# Patient Record
Sex: Female | Born: 1985 | Race: Black or African American | Hispanic: No | Marital: Single | Smoking: Current every day smoker
Health system: Southern US, Community
[De-identification: ages and names within clinical notes are randomized; demographics above are authoritative.]

## PROBLEM LIST (undated history)

## (undated) DIAGNOSIS — R87629 Unspecified abnormal cytological findings in specimens from vagina: Secondary | ICD-10-CM

## (undated) DIAGNOSIS — D649 Anemia, unspecified: Secondary | ICD-10-CM

## (undated) DIAGNOSIS — J449 Chronic obstructive pulmonary disease, unspecified: Secondary | ICD-10-CM

## (undated) HISTORY — PX: NO PAST SURGERIES: SHX2092

---

## 2012-07-17 ENCOUNTER — Ambulatory Visit: Payer: Self-pay

## 2013-07-30 ENCOUNTER — Encounter (HOSPITAL_COMMUNITY): Payer: Self-pay | Admitting: *Deleted

## 2013-07-30 ENCOUNTER — Inpatient Hospital Stay (HOSPITAL_COMMUNITY): Admission: AD | Admit: 2013-07-30 | Payer: Self-pay | Admitting: Obstetrics & Gynecology

## 2013-07-30 ENCOUNTER — Inpatient Hospital Stay (HOSPITAL_COMMUNITY)
Admission: AD | Admit: 2013-07-30 | Discharge: 2013-07-30 | Disposition: A | Discharge status: Home / Self Care | Payer: Self-pay | Source: Ambulatory Visit | Attending: Obstetrics & Gynecology | Admitting: Obstetrics & Gynecology

## 2013-07-30 ENCOUNTER — Ambulatory Visit (INDEPENDENT_AMBULATORY_CARE_PROVIDER_SITE_OTHER): Payer: Self-pay | Admitting: Emergency Medicine

## 2013-07-30 VITALS — BP 132/82 | HR 83 | Temp 98.5°F | Resp 17 | Ht 61.0 in | Wt 278.0 lb

## 2013-07-30 DIAGNOSIS — A499 Bacterial infection, unspecified: Secondary | ICD-10-CM | POA: Insufficient documentation

## 2013-07-30 DIAGNOSIS — N949 Unspecified condition associated with female genital organs and menstrual cycle: Secondary | ICD-10-CM

## 2013-07-30 DIAGNOSIS — R102 Pelvic and perineal pain: Secondary | ICD-10-CM

## 2013-07-30 DIAGNOSIS — N939 Abnormal uterine and vaginal bleeding, unspecified: Secondary | ICD-10-CM

## 2013-07-30 DIAGNOSIS — B9689 Other specified bacterial agents as the cause of diseases classified elsewhere: Secondary | ICD-10-CM | POA: Insufficient documentation

## 2013-07-30 DIAGNOSIS — F17200 Nicotine dependence, unspecified, uncomplicated: Secondary | ICD-10-CM | POA: Insufficient documentation

## 2013-07-30 DIAGNOSIS — R109 Unspecified abdominal pain: Secondary | ICD-10-CM | POA: Insufficient documentation

## 2013-07-30 DIAGNOSIS — N938 Other specified abnormal uterine and vaginal bleeding: Secondary | ICD-10-CM | POA: Insufficient documentation

## 2013-07-30 DIAGNOSIS — R350 Frequency of micturition: Secondary | ICD-10-CM

## 2013-07-30 DIAGNOSIS — N760 Acute vaginitis: Secondary | ICD-10-CM | POA: Insufficient documentation

## 2013-07-30 HISTORY — DX: Unspecified abnormal cytological findings in specimens from vagina: R87.629

## 2013-07-30 LAB — URINE MICROSCOPIC-ADD ON

## 2013-07-30 LAB — WET PREP, GENITAL
Trich, Wet Prep: NONE SEEN
Yeast Wet Prep HPF POC: NONE SEEN

## 2013-07-30 LAB — CBC
HCT: 36.1 % (ref 36.0–46.0)
Hemoglobin: 12.1 g/dL (ref 12.0–15.0)
MCH: 26.6 pg (ref 26.0–34.0)
MCHC: 33.5 g/dL (ref 30.0–36.0)
MCV: 79.3 fL (ref 78.0–100.0)
PLATELETS: 283 10*3/uL (ref 150–400)
RBC: 4.55 MIL/uL (ref 3.87–5.11)
RDW: 15.3 % (ref 11.5–15.5)
WBC: 11.2 10*3/uL — AB (ref 4.0–10.5)

## 2013-07-30 LAB — POCT URINALYSIS DIPSTICK
Bilirubin, UA: NEGATIVE
Glucose, UA: NEGATIVE
KETONES UA: NEGATIVE
Nitrite, UA: NEGATIVE
Protein, UA: NEGATIVE
Spec Grav, UA: 1.015
Urobilinogen, UA: 1
pH, UA: 7

## 2013-07-30 LAB — POCT UA - MICROSCOPIC ONLY
CASTS, UR, LPF, POC: NEGATIVE
Crystals, Ur, HPF, POC: NEGATIVE
Mucus, UA: POSITIVE
RBC, urine, microscopic: NEGATIVE
Yeast, UA: NEGATIVE

## 2013-07-30 LAB — URINALYSIS, ROUTINE W REFLEX MICROSCOPIC
Bilirubin Urine: NEGATIVE
Glucose, UA: NEGATIVE mg/dL
Ketones, ur: NEGATIVE mg/dL
Nitrite: NEGATIVE
Protein, ur: NEGATIVE mg/dL
SPECIFIC GRAVITY, URINE: 1.01 (ref 1.005–1.030)
UROBILINOGEN UA: 1 mg/dL (ref 0.0–1.0)
pH: 6 (ref 5.0–8.0)

## 2013-07-30 LAB — POCT URINE PREGNANCY: PREG TEST UR: NEGATIVE

## 2013-07-30 MED ORDER — METRONIDAZOLE 500 MG PO TABS: 500.0000 mg | ORAL_TABLET | Freq: Two times a day (BID) | ORAL | Status: DC

## 2013-07-30 NOTE — Patient Instructions (Addendum)
Please go immediately to [facility] ER for further evaluation    Driving directions to [facility]  [phone]  - more info    [address]  [place], [place] [address]     1. Head south on [address] toward [address]      0.5 mi    2. Sharp left onto [address]      0.6 mi    3. Turn left onto the [address] ramp      0.2 mi    4. Merge onto [address]      3.0 mi    5. Slight right toward [address] (signs for [address])      0.2 mi    6. Take the ramp to [address]      338 ft    7. Turn left onto [address]      0.3 mi    8. Turn left onto [address]  Destination will be on the right     0.2 mi     [facility]  [address]

## 2013-07-30 NOTE — MAU Provider Note (Signed)
History     CSN: [id number]  Arrival date and time: 07/30/13 1425   First Provider Initiated Contact with Patient 07/30/13 1606      Chief Complaint  Patient presents with  . Vaginal Bleeding   HPI Maria Mccarthy is a 28 y.o. G0P0 who presents to MAU today with complaint of vaginal bleeding daily x 2 years. The patient states that about 50% of the time the bleeding is heavy, otherwise she has light bleeding or spotting. The patient has a history of irregular bleeding and had work[place]up in [place] in [place] but moved from there prior to completing work-up and is unsure of the results. She is also having lower abdominal pain that comes and goes and occasional vaginal pains as well x 2 years. She denies fever, discharge or N/V/D today. She has not been on anything for birth control recently.   OB History   Grav Para Term Preterm Abortions TAB SAB Ect Mult Living   0         0      Past Medical History  Diagnosis Date  . Vaginal Pap smear, abnormal     Past Surgical History  Procedure Laterality Date  . No past surgeries      Family History  Problem Relation Age of Onset  . Mental illness Mother   . Diabetes Maternal Grandmother   . Hypertension Maternal Grandmother   . Diabetes Maternal Grandfather   . Diabetes Paternal Grandfather     History  Substance Use Topics  . Smoking status: Current Some Day Smoker -- 0.25 packs/day  . Smokeless tobacco: Not on file  . Alcohol Use: No    Allergies: No Known Allergies  No prescriptions prior to admission    Review of Systems  Constitutional: Negative for fever and malaise/fatigue.  Gastrointestinal: Positive for abdominal pain. Negative for nausea, vomiting and diarrhea.  Genitourinary: Negative for dysuria, urgency and frequency.       + vaginal bleeding  Neurological: Negative for dizziness, loss of consciousness and weakness.   Physical Exam   Blood pressure 133/79, pulse 88, temperature 98.3 F (36.8 C),  temperature source Oral, resp. rate 18, height 5' 3.75" (1.619 m), weight 279 lb 6.4 oz (126.735 kg), last menstrual period 07/30/2013, SpO2 100.00%.  Physical Exam  Constitutional: She is oriented to person, place, and time. She appears well-developed and well-nourished. No distress.  HENT:  Head: Normocephalic and atraumatic.  Cardiovascular: Normal rate.   Respiratory: Effort normal.  GI: Soft. Bowel sounds are normal. She exhibits no distension and no mass. There is tenderness (mild tenderness to palpation of the lower abdomen). There is no rebound and no guarding.  Genitourinary: Uterus is not enlarged (exam limited by patient body habitus) and not tender. Cervix exhibits no motion tenderness, no discharge and no friability. Right adnexum displays no mass and no tenderness. Left adnexum displays no mass and no tenderness. There is bleeding (scant blood) around the vagina. Vaginal discharge (scant thin, white discharge noted) found.  Neurological: She is alert and oriented to person, place, and time.  Skin: Skin is warm and dry. No erythema.  Psychiatric: She has a normal mood and affect.   Results for orders placed during the hospital encounter of 07/30/13 (from the past 24 hour(s))  URINALYSIS, ROUTINE W REFLEX MICROSCOPIC     Status: Abnormal   Collection Time    07/30/13  3:17 PM      Result Value Ref Range   Color, Urine YELLOW  YELLOW   APPearance CLEAR  CLEAR   Specific Gravity, Urine 1.010  1.005 - 1.030   pH 6.0  5.0 - 8.0   Glucose, UA NEGATIVE  NEGATIVE mg/dL   Hgb urine dipstick LARGE (*) NEGATIVE   Bilirubin Urine NEGATIVE  NEGATIVE   Ketones, ur NEGATIVE  NEGATIVE mg/dL   Protein, ur NEGATIVE  NEGATIVE mg/dL   Urobilinogen, UA 1.0  0.0 - 1.0 mg/dL   Nitrite NEGATIVE  NEGATIVE   Leukocytes, UA SMALL (*) NEGATIVE  URINE MICROSCOPIC-ADD ON     Status: Abnormal   Collection Time    07/30/13  3:17 PM      Result Value Ref Range   Squamous Epithelial / LPF FEW (*) RARE    WBC, UA 3-6  <3 WBC/hpf   RBC / HPF 0-2  <3 RBC/hpf  CBC     Status: Abnormal   Collection Time    07/30/13  3:45 PM      Result Value Ref Range   WBC 11.2 (*) 4.0 - 10.5 K/uL   RBC 4.55  3.87 - 5.11 MIL/uL   Hemoglobin 12.1  12.0 - 15.0 g/dL   HCT 27.236.1  53.636.0 - 64.446.0 %   MCV 79.3  78.0 - 100.0 fL   MCH 26.6  26.0 - 34.0 pg   MCHC 33.5  30.0 - 36.0 g/dL   RDW 03.415.3  74.211.5 - 59.515.5 %   Platelets 283  150 - 400 K/uL  WET PREP, GENITAL     Status: Abnormal   Collection Time    07/30/13  5:03 PM      Result Value Ref Range   Yeast Wet Prep HPF POC NONE SEEN  NONE SEEN   Trich, Wet Prep NONE SEEN  NONE SEEN   Clue Cells Wet Prep HPF POC FEW (*) NONE SEEN   WBC, Wet Prep HPF POC FEW (*) NONE SEEN     MAU Course  Procedures None  MDM UPT- negative at Urgent Care today UA, wet prep, GC/Chlamydia, CBC Patient is hemodynamically stable today. 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 They will call her with an appointment Patient may return to MAU as needed or if her condition were to change or worsen  Freddi StarrJulie N Ethier, PA-C  07/30/2013, 8:34 PM

## 2013-07-30 NOTE — Discharge Instructions (Signed)

## 2013-07-30 NOTE — Progress Notes (Signed)
   This chart was scribed for Maria ChickKristi M Smith, MD by [name], ED Scribe. This patient was seen in room 5 and the patient's care was started at 12:31 PM.  Subjective:    Patient ID: Maria LemaSharonda Mccarthy, female    DOB: 28-Sep-1985, 28 y.o.   MRN: [id number]  HPI HPI Comments: Maria LemaSharonda Bianca is a 28 y.o. female who [facility] complaining of abdominal pain that radiates to her sides and vaginal area over the past 3 weeks. Pt reports associated vaginal discharge, cloudy urine, and urine odor. She states she's been bleeding nonstop for roughly a year and 8 months. She states she is not on any birth control. She states her insurance for her new job has not yet kicked in. Pt reports she would like to see a gynecologist.    Review of Systems  Gastrointestinal: Positive for abdominal pain.  Genitourinary: Positive for vaginal bleeding, vaginal discharge and vaginal pain.       Urinary odor and cloudiness       Objective:   Physical Exam CONSTITUTIONAL: Well developed/well nourished HEAD: Normocephalic/atraumatic EYES: EOMI/PERRL ENMT: Mucous membranes moist NECK: supple no meningeal signs SPINE:entire spine nontender CV: S1/S2 noted, no murmurs/rubs/gallops noted LUNGS: Lungs are clear to auscultation bilaterally, no apparent distress ABDOMEN: soft, nontender, no rebound or guarding, 1.5cm tissue mass protruding from the cervical os GU:no cva tenderness NEURO: Pt is awake/alert, moves all extremitiesx4 EXTREMITIES: pulses normal, full ROM SKIN: warm, color normal PSYCH: no abnormalities of mood noted   Results for orders placed in visit on 07/30/13  POCT URINALYSIS DIPSTICK      Result Value Ref Range   Color, UA yellow     Clarity, UA clear     Glucose, UA neg     Bilirubin, UA neg     Ketones, UA neg     Spec Grav, UA 1.015     Blood, UA trace     pH, UA 7.0     Protein, UA neg     Urobilinogen, UA 1.0     Nitrite, UA neg     Leukocytes, UA Trace    POCT URINE PREGNANCY      Result Value Ref Range   Preg Test, Ur Negative    POCT UA - MICROSCOPIC ONLY      Result Value Ref Range   WBC, Ur, HPF, POC 0-3     RBC, urine, microscopic neg     Bacteria, U Microscopic 2+     Mucus, UA positive     Epithelial cells, urine per micros 0-1     Crystals, Ur, HPF, POC neg     Casts, Ur, LPF, POC neg     Yeast, UA neg         & Plan:  Initial recording of the pregnancy test was positive but apparently this was a computer entry error. Her pregnancy test is not positive. She certainly could have an endometrial cast versus degenerating polyp which is extruding through the cervical os. She will be sent to women's for their further evaluation. Apparently she has had long-standing problems with vaginal bleeding. I personally performed the services described in this documentation, which was scribed in my presence. The recorded information has been reviewed and is accurate.

## 2013-07-30 NOTE — MAU Note (Addendum)
Pt states she has been having daily vaginal bleeding x 2 years now and hasn't seen a GYN for 2 years as well.  Pt presented to an urgent care today and was told to come to MAU for further evaluation.  Pt presented there with C/O of foul odor to urine.Pt states she has been having intermit. abd pain which she rates at 8 of 10 when she has the pain.  Pt states the MD said some tissue was coming out of the uterus/cervix.

## 2013-07-31 LAB — GC/CHLAMYDIA PROBE AMP
CT PROBE, AMP APTIMA: NEGATIVE
GC PROBE AMP APTIMA: NEGATIVE

## 2013-08-05 ENCOUNTER — Encounter: Payer: Self-pay | Admitting: Obstetrics & Gynecology

## 2013-08-07 ENCOUNTER — Ambulatory Visit (HOSPITAL_COMMUNITY)
Admission: RE | Admit: 2013-08-07 | Discharge: 2013-08-07 | Disposition: A | Discharge status: Home / Self Care | Payer: Self-pay | Source: Ambulatory Visit | Attending: Medical | Admitting: Medical

## 2013-08-07 DIAGNOSIS — N938 Other specified abnormal uterine and vaginal bleeding: Secondary | ICD-10-CM | POA: Insufficient documentation

## 2013-08-07 DIAGNOSIS — N8300 Follicular cyst of ovary, unspecified side: Secondary | ICD-10-CM | POA: Insufficient documentation

## 2013-08-07 DIAGNOSIS — N854 Malposition of uterus: Secondary | ICD-10-CM | POA: Insufficient documentation

## 2013-08-07 DIAGNOSIS — N949 Unspecified condition associated with female genital organs and menstrual cycle: Secondary | ICD-10-CM | POA: Insufficient documentation

## 2013-08-07 DIAGNOSIS — N83209 Unspecified ovarian cyst, unspecified side: Secondary | ICD-10-CM | POA: Insufficient documentation

## 2013-08-07 DIAGNOSIS — N939 Abnormal uterine and vaginal bleeding, unspecified: Secondary | ICD-10-CM

## 2013-08-19 ENCOUNTER — Telehealth: Payer: Self-pay | Admitting: *Deleted

## 2013-08-19 ENCOUNTER — Telehealth: Payer: Self-pay | Admitting: General Practice

## 2013-08-19 NOTE — Telephone Encounter (Signed)
Patient called and left message stating she received a letter in the mail with her next appt with Dr Debroah LoopArnold but has not heard back yet about her results and would like for someone to call her. Called patient and informed her of normal results except for bacterial vaginosis that came back, but she has already been treated for that. Patient verbalized understanding and had no further questions

## 2013-08-19 NOTE — Telephone Encounter (Signed)
Pt called nurse line requesting lab results from MAU and sonogram.  Gave results and pt continues to have concern over whether she was pregnant,  Informed her pregnancy test here was NEGATIVE.  Educated patient that provider will go over all information at her next visit.  Pt verbalizes understanding.

## 2013-09-11 ENCOUNTER — Ambulatory Visit (INDEPENDENT_AMBULATORY_CARE_PROVIDER_SITE_OTHER): Payer: Self-pay | Admitting: Obstetrics & Gynecology

## 2013-09-11 ENCOUNTER — Encounter: Payer: Self-pay | Admitting: Obstetrics & Gynecology

## 2013-09-11 VITALS — BP 96/63 | HR 73 | Temp 97.8°F | Ht 62.0 in | Wt 276.0 lb

## 2013-09-11 DIAGNOSIS — N390 Urinary tract infection, site not specified: Secondary | ICD-10-CM

## 2013-09-11 DIAGNOSIS — N938 Other specified abnormal uterine and vaginal bleeding: Secondary | ICD-10-CM

## 2013-09-11 DIAGNOSIS — N949 Unspecified condition associated with female genital organs and menstrual cycle: Secondary | ICD-10-CM

## 2013-09-11 LAB — POCT URINALYSIS DIP (DEVICE)
Bilirubin Urine: NEGATIVE
Glucose, UA: NEGATIVE mg/dL
Ketones, ur: NEGATIVE mg/dL
Nitrite: POSITIVE — AB
Protein, ur: NEGATIVE mg/dL
Specific Gravity, Urine: 1.025 (ref 1.005–1.030)
UROBILINOGEN UA: 0.2 mg/dL (ref 0.0–1.0)
pH: 6 (ref 5.0–8.0)

## 2013-09-11 MED ORDER — NORGESTIM-ETH ESTRAD TRIPHASIC 0.18/0.215/0.25 MG-35 MCG PO TABS: 1.0000 | ORAL_TABLET | Freq: Every day | ORAL | Status: DC

## 2013-09-11 MED ORDER — SULFAMETHOXAZOLE-TMP DS 800-160 MG PO TABS: 1.0000 | ORAL_TABLET | Freq: Two times a day (BID) | ORAL | Status: DC

## 2013-09-11 NOTE — Patient Instructions (Signed)
Oral Contraception Information  Oral contraceptive pills (OCPs) are medicines taken to prevent pregnancy. OCPs work by preventing the ovaries from releasing eggs. The hormones in OCPs also cause the cervical mucus to thicken, preventing the sperm from entering the uterus. The hormones also cause the uterine lining to become thin, not allowing a fertilized egg to attach to the inside of the uterus. OCPs are highly effective when taken exactly as prescribed. However, OCPs do not prevent sexually transmitted diseases (STDs). Safe sex practices, such as using condoms along with the pill, can help prevent STDs.   Before taking the pill, you may have a physical exam and Pap test. Your health care provider may order blood tests. The health care provider will make sure you are a good candidate for oral contraception. Discuss with your health care provider the possible side effects of the OCP you may be prescribed. When starting an OCP, it can take 2 to 3 months for the body to adjust to the changes in hormone levels in your body.   TYPES OF ORAL CONTRACEPTION  · The combination pill This pill contains estrogen and progestin (synthetic progesterone) hormones. The combination pill comes in 21-day, 28-day, or 91-day packs. Some types of combination pills are meant to be taken continuously (365-day pills). With 21-day packs, you do not take pills for 7 days after the last pill. With 28-day packs, the pill is taken every day. The last 7 pills are without hormones. Certain types of pills have more than 21 hormone-containing pills. With 91-day packs, the first 84 pills contain both hormones, and the last 7 pills contain no hormones or contain estrogen only.  · The minipill This pill contains the progesterone hormone only. The pill is taken every day continuously. It is very important to take the pill at the same time each day. The minipill comes in packs of 28 pills. All 28 pills contain the hormone.    ADVANTAGES OF ORAL  CONTRACEPTIVE PILLS  · Decreases premenstrual symptoms.    · Treats menstrual period cramps.    · Regulates the menstrual cycle.    · Decreases a heavy menstrual flow.    · May treat acne, depending on the type of pill.    · Treats abnormal uterine bleeding.    · Treats polycystic ovarian syndrome.    · Treats endometriosis.    · Can be used as emergency contraception.    THINGS THAT CAN MAKE ORAL CONTRACEPTIVE PILLS LESS EFFECTIVE  OCPs can be less effective if:   · You forget to take the pill at the same time every day.    · You have a stomach or intestinal disease that lessens the absorption of the pill.    · You take OCPs with other medicines that make OCPs less effective, such as antibiotics, certain HIV medicines, and some seizure medicines.    · You take expired OCPs.    · You forget to restart the pill on day 7, when using the packs of 21 pills.    RISKS ASSOCIATED WITH ORAL CONTRACEPTIVE PILLS   Oral contraceptive pills can sometimes cause side effects, such as:  · Headache.  · Nausea.  · Breast tenderness.  · Irregular bleeding or spotting.  Combination pills are also associated with a small increased risk of:  · Blood clots.  · Heart attack.  · Stroke.  Document Released: 06/17/2002 Document Revised: 01/15/2013 Document Reviewed: 09/15/2012  Exit[employer]® Patient Information ©2014 [employer].

## 2013-09-11 NOTE — Progress Notes (Signed)
Patient ID: Maria Mccarthy, female   DOB: Oct 11, 1985, 28 y.o.   MRN: [id number]  Chief Complaint  Patient presents with  . Follow-up    HPI Maria Mccarthy is a 28 y.o. female.  G1P0010 Patient's last menstrual period was 09/11/2013. H/O DUB for years, was partially evaluated in [place] but received no Dx. Bleeding stopped for a few weeks after last MAU visit but some bleeding today  HPI  Past Medical History  Diagnosis Date  . Vaginal Pap smear, abnormal     Past Surgical History  Procedure Laterality Date  . No past surgeries      Family History  Problem Relation Age of Onset  . Mental illness Mother   . Diabetes Maternal Grandmother   . Hypertension Maternal Grandmother   . Diabetes Maternal Grandfather   . Diabetes Paternal Grandfather     Social History History  Substance Use Topics  . Smoking status: Current Some Day Smoker -- 0.25 packs/day    Types: Cigarettes  . Smokeless tobacco: Not on file  . Alcohol Use: No    No Known Allergies  Current Outpatient Prescriptions  Medication Sig Dispense Refill  . Norgestimate-Ethinyl Estradiol Triphasic 0.18/0.215/0.25 MG-35 MCG tablet Take 1 tablet by mouth daily.  1 Package  11  . sulfamethoxazole-trimethoprim (BACTRIM DS) 800-160 MG per tablet Take 1 tablet by mouth 2 (two) times daily.  6 tablet  0   No current facility-administered medications for this visit.    Review of Systems Review of Systems  Constitutional: Negative.   Genitourinary: Positive for vaginal bleeding and menstrual problem. Negative for vaginal discharge and pelvic pain.    Blood pressure 96/63, pulse 73, temperature 97.8 F (36.6 C), height 5\' 2"  (1.575 m), weight 125.193 kg (276 lb), last menstrual period 09/11/2013.  Physical Exam Physical Exam  Constitutional: She appears well-developed. No distress.  Pulmonary/Chest: Effort normal.  Abdominal: Soft.  Genitourinary: Vagina normal and uterus normal. No vaginal discharge found.  Skin:  Skin is warm and dry.  Psychiatric: She has a normal mood and affect.    Data Reviewed [place] normal  Assessment    DUB, likely anovulation     Plan    Ortho Tricyclen Pap in 3 months, RTC        Adam Phenix 09/11/2013, 3:00 PM

## 2013-09-11 NOTE — Progress Notes (Signed)
Pt feels like she has urinary tract infection due to frequency of urination.  Pt states she also has sharp pains in her vagina

## 2014-02-09 ENCOUNTER — Encounter: Payer: Self-pay | Admitting: Obstetrics & Gynecology

## 2014-06-10 ENCOUNTER — Encounter (HOSPITAL_COMMUNITY): Payer: Self-pay | Admitting: Emergency Medicine

## 2014-06-10 ENCOUNTER — Emergency Department (HOSPITAL_COMMUNITY)
Admission: EM | Admit: 2014-06-10 | Discharge: 2014-06-10 | Disposition: A | Discharge status: Home / Self Care | Payer: Self-pay | Attending: Emergency Medicine | Admitting: Emergency Medicine

## 2014-06-10 DIAGNOSIS — Z3202 Encounter for pregnancy test, result negative: Secondary | ICD-10-CM | POA: Insufficient documentation

## 2014-06-10 DIAGNOSIS — Z793 Long term (current) use of hormonal contraceptives: Secondary | ICD-10-CM | POA: Insufficient documentation

## 2014-06-10 DIAGNOSIS — R319 Hematuria, unspecified: Secondary | ICD-10-CM | POA: Insufficient documentation

## 2014-06-10 DIAGNOSIS — R300 Dysuria: Secondary | ICD-10-CM | POA: Insufficient documentation

## 2014-06-10 DIAGNOSIS — Z79899 Other long term (current) drug therapy: Secondary | ICD-10-CM | POA: Insufficient documentation

## 2014-06-10 DIAGNOSIS — J029 Acute pharyngitis, unspecified: Secondary | ICD-10-CM | POA: Insufficient documentation

## 2014-06-10 DIAGNOSIS — Z720 Tobacco use: Secondary | ICD-10-CM | POA: Insufficient documentation

## 2014-06-10 LAB — URINE MICROSCOPIC-ADD ON

## 2014-06-10 LAB — URINALYSIS, ROUTINE W REFLEX MICROSCOPIC
BILIRUBIN URINE: NEGATIVE
Glucose, UA: NEGATIVE mg/dL
Ketones, ur: NEGATIVE mg/dL
Leukocytes, UA: NEGATIVE
Nitrite: POSITIVE — AB
PH: 6.5 (ref 5.0–8.0)
Protein, ur: NEGATIVE mg/dL
Specific Gravity, Urine: 1.035 — ABNORMAL HIGH (ref 1.005–1.030)
Urobilinogen, UA: 1 mg/dL (ref 0.0–1.0)

## 2014-06-10 LAB — RAPID STREP SCREEN (MED CTR MEBANE ONLY): Streptococcus, Group A Screen (Direct): NEGATIVE

## 2014-06-10 LAB — PREGNANCY, URINE: PREG TEST UR: NEGATIVE

## 2014-06-10 MED ORDER — HYDROCODONE-ACETAMINOPHEN 7.5-325 MG/15ML PO SOLN: 15.0000 mL | Freq: Three times a day (TID) | ORAL | Status: DC | PRN

## 2014-06-10 MED ORDER — BENZONATATE 100 MG PO CAPS
100.0000 mg | ORAL_CAPSULE | Freq: Once | ORAL | Status: AC
  Administered 2014-06-10: 100 mg via ORAL
  Filled 2014-06-10: qty 1

## 2014-06-10 MED ORDER — SULFAMETHOXAZOLE-TRIMETHOPRIM 800-160 MG PO TABS: 1.0000 | ORAL_TABLET | Freq: Two times a day (BID) | ORAL | Status: DC

## 2014-06-10 NOTE — ED Notes (Signed)
Pt alert and oriented x4. Respirations even and unlabored, bilateral symmetrical rise and fall of chest. Skin warm and dry. In no acute distress. Denies needs.   

## 2014-06-10 NOTE — ED Provider Notes (Signed)
CSN: [id number]     Arrival date & time 06/10/14  1016 History   First MD Initiated Contact with Patient 06/10/14 1210     Chief Complaint  Patient presents with  . Cough  . Sore Throat  . Hematuria     (Consider location/radiation/quality/duration/timing/severity/associated sxs/prior Treatment) HPI Comments: The patient is a 29 year old female, she presents to the hospital with approximately one week of cough, sore throat and hoarseness. The hoarseness of her voice started several days ago, she denies fevers chills nausea or vomiting and has had no diarrhea. The symptoms are persistent, nothing makes this better or worse, she has used over-the-counter medications without relief, she continues to cough, more at night.  Patient is a 29 y.o. female presenting with cough, pharyngitis, and hematuria. The history is provided by the patient.  Cough Sore Throat  Hematuria    Past Medical History  Diagnosis Date  . Vaginal Pap smear, abnormal    Past Surgical History  Procedure Laterality Date  . No past surgeries     Family History  Problem Relation Age of Onset  . Mental illness Mother   . Diabetes Maternal Grandmother   . Hypertension Maternal Grandmother   . Diabetes Maternal Grandfather   . Diabetes Paternal Grandfather    History  Substance Use Topics  . Smoking status: Current Some Day Smoker -- 0.25 packs/day    Types: Cigarettes  . Smokeless tobacco: Not on file  . Alcohol Use: No   OB History    Gravida Para Term Preterm AB TAB SAB Ectopic Multiple Living   1    1  1    0     Review of Systems  Respiratory: Positive for cough.   Genitourinary: Positive for hematuria.  All other systems reviewed and are negative.     Allergies  Review of patient's allergies indicates no known allergies.  Home Medications   Prior to Admission medications   Medication Sig Start Date End Date Taking? Authorizing Provider  acetaminophen (TYLENOL) 325 MG tablet Take 650 mg  by mouth every 6 (six) hours as needed for moderate pain or fever.   Yes Historical Provider, MD  Cyanocobalamin (VITAMIN B 12 PO) Take 1 tablet by mouth daily.   Yes Historical Provider, MD  DM-Doxylamine-Acetaminophen (NYQUIL COLD & FLU PO) Take 10 mLs by mouth every 6 (six) hours as needed (cold and flu symptoms).   Yes Historical Provider, MD  Multiple Vitamins-Minerals (MULTIVITAMIN ADULT PO) Take 1 tablet by mouth daily.   Yes Historical Provider, MD  HYDROcodone-acetaminophen (HYCET) 7.5-325 mg/15 ml solution Take 15 mLs by mouth every 8 (eight) hours as needed for moderate pain. 06/10/14   Vida RollerBrian D Kaz Auld, MD  Norgestimate-Ethinyl Estradiol Triphasic 0.18/0.215/0.25 MG-35 MCG tablet Take 1 tablet by mouth daily. 09/11/13   Adam PhenixJames G Arnold, MD  sulfamethoxazole-trimethoprim (SEPTRA DS) 800-160 MG per tablet Take 1 tablet by mouth 2 (two) times daily. 06/10/14   Vida RollerBrian D Marti Acebo, MD   BP 102/78 mmHg  Pulse 72  Temp(Src) 97.6 F (36.4 C) (Oral)  Resp 17  SpO2 100% Physical Exam  Constitutional: She appears well-developed and well-nourished. No distress.  HENT:  Head: Normocephalic and atraumatic.  Mouth/Throat: No oropharyngeal exudate.  Pharyngeal erythema, no exudate asymmetry or hypertrophy, mucous members are moist, voice is hoarse  Eyes: Conjunctivae and EOM are normal. Pupils are equal, round, and reactive to light. Right eye exhibits no discharge. Left eye exhibits no discharge. No scleral icterus.  Neck: Normal range of  motion. Neck supple. No JVD present. No thyromegaly present.  No lymphadenopathy of the neck  Cardiovascular: Normal rate, regular rhythm, normal heart sounds and intact distal pulses.  Exam reveals no gallop and no friction rub.   No murmur heard. Pulmonary/Chest: Effort normal and breath sounds normal. No respiratory distress. She has no wheezes. She has no rales.  Abdominal: Soft. Bowel sounds are normal. She exhibits no distension and no mass. There is no tenderness.   Musculoskeletal: Normal range of motion. She exhibits no edema or tenderness.  Lymphadenopathy:    She has no cervical adenopathy.  Neurological: She is alert. Coordination normal.  Skin: Skin is warm and dry. No rash noted. No erythema.  Psychiatric: She has a normal mood and affect. Her behavior is normal.  Nursing note and vitals reviewed.   ED Course  Procedures (including critical care time) Labs Review Labs Reviewed  URINALYSIS, ROUTINE W REFLEX MICROSCOPIC - Abnormal; Notable for the following:    APPearance CLOUDY (*)    Specific Gravity, Urine 1.035 (*)    Hgb urine dipstick TRACE (*)    Nitrite POSITIVE (*)    All other components within normal limits  URINE MICROSCOPIC-ADD ON - Abnormal; Notable for the following:    Bacteria, UA MANY (*)    All other components within normal limits  RAPID STREP SCREEN  CULTURE, GROUP A STREP  PREGNANCY, URINE    Imaging Review No results found.    MDM   Final diagnoses:  Dysuria  Sore throat    Vital signs normal, the patient is benign appearance, she has reported to the nurse that she has had some dysuria and hematuria, additionally upper respiratory symptoms with sore throat bags the question of whether this is bacterial but I suspect is going to be viral, check strep, check urine, symptomatically for cough.  Labs neg, bacteriuria.  VS normal, stable for dc.  Meds given in ED:  Medications  benzonatate (TESSALON) capsule 100 mg (100 mg Oral Given 06/10/14 1235)    New Prescriptions   HYDROCODONE-ACETAMINOPHEN (HYCET) 7.5-325 MG/15 ML SOLUTION    Take 15 mLs by mouth every 8 (eight) hours as needed for moderate pain.   SULFAMETHOXAZOLE-TRIMETHOPRIM (SEPTRA DS) 800-160 MG PER TABLET    Take 1 tablet by mouth 2 (two) times daily.        Vida Roller, MD 06/10/14 [phone]

## 2014-06-10 NOTE — ED Notes (Signed)
Pt escorted to discharge window. Pt verbalized understanding discharge instructions. In no acute distress.  

## 2014-06-10 NOTE — ED Notes (Signed)
Pt c/o cough, sore throat and hoarseness x a week and a half. States that since she started getting sick, she began having dysuria and hematuria.  Concerned that it may be related. Also wants to see someone about daily headaches since November.

## 2014-06-10 NOTE — Discharge Instructions (Signed)
Please call your doctor for a followup appointment within 24-48 hours. When you talk to your doctor please let them know that you were seen in the emergency department and have them acquire all of your records so that they can discuss the findings with you and formulate a treatment plan to fully care for your new and ongoing problems. ° °RESOURCE GUIDE ° °Chronic Pain Problems: °Contact [facility]  [phone] °Patients need to be referred by their primary care doctor. ° °Insufficient Money for Medicine: °Contact [employer]:  call "211."  ° °No Primary Care Doctor: °- Call [facility]  [phone] - can help you locate a primary care doctor that  accepts your insurance, provides certain services, etc. °- Physician Referral Service- 1-800-533-3463 ° °Agencies that provide inexpensive medical care: °- [facility]  [phone] °- [facility]  [phone] °- [facility]  [phone] °- [facility]  [phone] °- [facility]  [phone] °- [facility]  [phone] ° °Medicaid-accepting [place] Providers: °- [facility]- [address] ° [phone], Mon-Fri 9am-7pm, Sat 9am-1pm °- [facility]- [address], [address] ° [phone] °- [facility]- [address], [address] ° [phone] °- [facility]- [address] [place] [address] ° [phone] °- [facility]- [address], [address], [phone] ° Only accepts [place] Access Medicaid patients after they have their name  applied to their card ° °Self Pay (no insurance) in [place]: °- Sickle Cell Patients: Dr Eric Dean, [facility] ° [address], [phone] °- [facility]- [address] ° [phone] °      -      [facility]- [address] °      -     [facility]- see information above (Speak to Pam H if you do not have  insurance) °      -  [facility] [place]- [address],  [phone] °      -  [facility]- [address] [place] [address], [phone] °      -  Dr Osei-Bonsu-  [address], [place], [phone] °      -  [facility] - [address], [phone] °      -  [facility]- [address] [place] [address], [phone], also [address], [phone] °      -    [facility]- [address], [phone], 1st & 3rd Saturday °       every month, 10am-1pm ° °[facility] °[address] °[place], [place] [address] °[phone] ° °[facility] °[address] °[place], [place] [address] °([phone] ° °1) Find a Doctor and Pay Out of Pocket °Although you won't have to find out who is covered by your insurance plan, it is a good idea to ask around and get recommendations. You will then need to call the office and see if the doctor you have chosen will accept you as a new patient and what types of options they offer for patients who are self-pay. Some doctors offer discounts or will set up payment plans for their patients who do not have insurance, but you will need to ask so you aren't surprised when you   get to your appointment. ° °2) Contact Your Local Health Department °Not all health departments have doctors that can see patients for sick visits, but many do, so it is worth a call to see if yours does. If you don't know where your local health department is, you can check in your phone book. The CDC also has a tool to help you locate your state's health department, and many state websites also have listings of all of their local health departments. ° °3) Find a Walk-in Clinic °If your illness is not likely to be very severe or complicated, you may want to try a walk in clinic. These are popping up all over the country in pharmacies, drugstores, and shopping centers. They're usually staffed by nurse practitioners or physician  assistants that have been trained to treat common illnesses and complaints. They're usually fairly quick and inexpensive. However, if you have serious medical issues or chronic medical problems, these are probably not your best option ° °STD Testing °- [place] [facility] [place], [facility], [address], [place], phone [phone] or [phone].  Monday - Friday, call for an appointment. °- [place] [facility] [place], [facility], [address], [place], phone [phone] or [phone].  Monday - Friday, call for an appointment. ° °Abuse/Neglect: °- [place] Child Abuse Hotline (336) 641-3795 °- [place] Child Abuse Hotline 800-378-5315 (After Hours) ° °Emergency Shelter:  [facility] (336) 271-5985 ° °Maternity Homes: °- [facility] (336) 275-9566 °- [facility] (704) 372-4663 ° °MRSA Hotline #:   832-7006 ° °Dental Assistance °If unable to pay or uninsured, contact:  [place] [facility] to become qualified for the adult dental clinic. ° °Patients with Medicaid: [facility] °[address], [phone] °[address], [phone] ° °If unable to pay, or uninsured, contact [place] [facility] ([phone] in [place], [phone] in [place]) to become qualified for the adult dental clinic ° °[facility] °[address] °, [place] [address] °(336) [phone] °[email or web address] ° °Other Low-Cost Community Dental Services: °- [facility]- [address], [place], [place], [address], [phone], [address]. [address], 2nd and 4th Thursday of the month at 6:30am.  10 clients each day by appointment, can sometimes see walk-in patients if someone does not show for an appointment. °- [facility]- [address], [place], [place], [address], [phone] °- [facility]- [address], [place], [place],  [address], [phone] °- [facility]- [phone] °- [facility]- [phone] °- [facility]- [phone] °-  °

## 2014-06-12 LAB — CULTURE, GROUP A STREP: STREP A CULTURE: NEGATIVE

## 2014-06-13 LAB — URINE CULTURE
Colony Count: >=100000
Special Requests: NORMAL

## 2014-06-14 ENCOUNTER — Telehealth (HOSPITAL_COMMUNITY): Payer: Self-pay

## 2014-06-14 NOTE — ED Notes (Signed)
Post ED Visit - Positive Culture Follow-up  Culture report reviewed by antimicrobial stewardship pharmacist: []  Wes Dulaney, Pharm.D., BCPS []  Celedonio MiyamotoJeremy Frens, Pharm.D., BCPS []  Georgina PillionElizabeth Martin, [address] BCPS []  Grand CoteauMinh Pham, [address]., BCPS, AAHIVP []  Estella HuskMichelle Turner, Pharm.D., BCPS, AAHIVP []  Elder CyphersLorie Poole, [address] BCPS Ben Manchenl Pharm D  Positive urine culture Treated with sulfamethoxazole-trimethoprim, organism sensitive to the same and no further patient follow-up is required at this time.  Ashley JacobsFesterman, Raevon Broom C 06/14/2014, 11:04 AM

## 2016-09-21 ENCOUNTER — Ambulatory Visit (INDEPENDENT_AMBULATORY_CARE_PROVIDER_SITE_OTHER): Payer: Managed Care, Other (non HMO) | Admitting: Obstetrics & Gynecology

## 2016-09-21 ENCOUNTER — Encounter: Payer: Self-pay | Admitting: Obstetrics & Gynecology

## 2016-09-21 VITALS — BP 79/54 | HR 95 | Ht 62.0 in | Wt 298.0 lb

## 2016-09-21 DIAGNOSIS — Z720 Tobacco use: Secondary | ICD-10-CM

## 2016-09-21 DIAGNOSIS — Z01419 Encounter for gynecological examination (general) (routine) without abnormal findings: Secondary | ICD-10-CM

## 2016-09-21 DIAGNOSIS — N970 Female infertility associated with anovulation: Secondary | ICD-10-CM

## 2016-09-21 DIAGNOSIS — Z3202 Encounter for pregnancy test, result negative: Secondary | ICD-10-CM

## 2016-09-21 DIAGNOSIS — Z113 Encounter for screening for infections with a predominantly sexual mode of transmission: Secondary | ICD-10-CM

## 2016-09-21 DIAGNOSIS — N939 Abnormal uterine and vaginal bleeding, unspecified: Secondary | ICD-10-CM

## 2016-09-21 LAB — POCT URINE PREGNANCY: PREG TEST UR: NEGATIVE

## 2016-09-21 MED ORDER — NORETHIN ACE-ETH ESTRAD-FE 1-20 MG-MCG(24) PO TABS: 1.0000 | ORAL_TABLET | Freq: Every day | ORAL | 11 refills | Status: DC

## 2016-09-21 NOTE — Patient Instructions (Signed)
Polycystic Ovarian Syndrome °Polycystic ovarian syndrome (PCOS) is a common hormonal disorder among women of reproductive age. In most women with PCOS, many small fluid-filled sacs (cysts) grow on the ovaries, and the cysts are not part of a normal menstrual cycle. PCOS can cause problems with your menstrual periods and make it difficult to get pregnant. It can also cause an increased risk of miscarriage with pregnancy. If it is not treated, PCOS can lead to serious health problems, such as diabetes and heart disease. °What are the causes? °The cause of PCOS is not known, but it may be the result of a combination of certain factors, such as: °· Irregular menstrual cycle. °· High levels of certain hormones (androgens). °· Problems with the hormone that helps to control blood sugar (insulin resistance). °· Certain genes. ° °What increases the risk? °This condition is more likely to develop in women who have a family history of PCOS. °What are the signs or symptoms? °Symptoms of PCOS may include: °· Multiple ovarian cysts. °· Infrequent periods or no periods. °· Periods that are too frequent or too heavy. °· Unpredictable periods. °· Inability to get pregnant (infertility) because of not ovulating. °· Increased growth of hair on the face, chest, stomach, back, thumbs, thighs, or toes. °· Acne or oily skin. Acne may develop during adulthood, and it may not respond to treatment. °· Pelvic pain. °· Weight gain or obesity. °· Patches of thickened and dark brown or black skin on the neck, arms, breasts, or thighs (acanthosis nigricans). °· Excess hair growth on the face, chest, abdomen, or upper thighs (hirsutism). ° °How is this diagnosed? °This condition is diagnosed based on: °· Your medical history. °· A physical exam, including a pelvic exam. Your health care provider may look for areas of increased hair growth on your skin. °· Tests, such as: °? Ultrasound. This may be used to examine the ovaries and the lining of the  uterus (endometrium) for cysts. °? Blood tests. These may be used to check levels of sugar (glucose), female hormone (testosterone), and female hormones (estrogen and progesterone) in your blood. ° °How is this treated? °There is no cure for PCOS, but treatment can help to manage symptoms and prevent more health problems from developing. Treatment varies depending on: °· Your symptoms. °· Whether you want to have a baby or whether you need birth control (contraception). ° °Treatment may include nutrition and lifestyle changes along with: °· Progesterone hormone to start a menstrual period. °· Birth control pills to help you have regular menstrual periods. °· Medicines to make you ovulate, if you want to get pregnant. °· Medicine to reduce excessive hair growth. °· Surgery, in severe cases. This may involve making small holes in one or both of your ovaries. This decreases the amount of testosterone that your body produces. ° °Follow these instructions at home: °· Take over-the-counter and prescription medicines only as told by your health care provider. °· Follow a healthy meal plan. This can help you reduce the effects of PCOS. °? Eat a healthy diet that includes lean proteins, complex carbohydrates, fresh fruits and vegetables, low-fat dairy products, and healthy fats. Make sure to eat enough fiber. °· If you are overweight, lose weight as told by your health care provider. °? Losing 10% of your body weight may improve symptoms. °? Your health care provider can determine how much weight loss is best for you and can help you lose weight safely. °· Keep all follow-up visits as told by   your health care provider. This is important. °Contact a health care provider if: °· Your symptoms do not get better with medicine. °· You develop new symptoms. °This information is not intended to replace advice given to you by your health care provider. Make sure you discuss any questions you have with your health care  provider. °Document Released: 07/21/2004 Document Revised: 11/23/2015 Document Reviewed: 09/12/2015 °Elsevier Interactive Patient Education © 2018 Elsevier Inc. ° °

## 2016-09-21 NOTE — Progress Notes (Signed)
History:  31 y.o. G0P0000 menarche 10 years. here today for AUB for 6 month daily. Bleeding is intermittently heavy and light but, the light is usually short. Prior to 6 month prev pt had 4 months of bleeding.  Pt reports irreg cycles since her menses began . The heavy bleeding began in 2012 the first time and lasted 4 month initially and then periodically had amenorrhea for 3 months. Pt reports that the last long term p[erisdo of amenorrhea was 1 years prev.  Pt was on OCPs in 2011 and 'they made me horribly sick.'  Pt c/o emesis and dry heaves.   Pt reports occ dizziness and often weakness. Pt denies SOB.  Smokes 1-2 cigs per day     The following portions of the patient's history were reviewed and updated as appropriate: allergies, current medications, past family history, past medical history, past social history, past surgical history and problem list.  Review of Systems:  Pertinent items are noted in HPI.   Objective:  Physical Exam Blood pressure (!) 79/54, pulse 95, height 5\' 2"  (1.575 m), weight 298 lb (135.2 kg), last menstrual period 04/12/2016. General Appearance:    Alert, cooperative, no distress, appears stated age  Head:    Normocephalic, without obvious abnormality, atraumatic  Eyes:    conjunctiva/corneas clear, EOM's intact, both eyes  Ears:    Normal external ear canals, both ears  Nose:   Nares normal, septum midline, mucosa normal, no drainage    or sinus tenderness  Throat:   Lips, mucosa, and tongue normal; teeth and gums normal  Neck:   Supple, symmetrical, trachea midline, no adenopathy;    thyroid:  no enlargement/tenderness/nodules  Back:     Symmetric, no curvature, ROM normal, no CVA tenderness  Lungs:     Clear to auscultation bilaterally, respirations unlabored  Chest Wall:    No tenderness or deformity   Heart:    Regular rate and rhythm, S1 and S2 normal, no murmur, rub   or gallop  Breast Exam:    No tenderness, masses, or nipple abnormality  Abdomen:      Soft, non-tender, bowel sounds active all four quadrants,    no masses, no organomegaly  Genitalia:    Normal female without lesion, discharge or tenderness     Extremities:   Extremities normal, atraumatic, no cyanosis or edema  Pulses:   2+ and symmetric all extremities  Skin:   Skin color, texture, turgor normal, no rashes or lesions     Labs and Imaging 08/07/2013 CLINICAL DATA:  Daily vaginal bleeding x2 years, bilateral pelvic pain  EXAM: TRANSABDOMINAL AND TRANSVAGINAL ULTRASOUND OF PELVIS  TECHNIQUE: Both transabdominal and transvaginal ultrasound examinations of the pelvis were performed. Transabdominal technique was performed for global imaging of the pelvis including uterus, ovaries, adnexal regions, and pelvic cul-de-sac. It was necessary to proceed with endovaginal exam following the transabdominal exam to visualize the right ovary and left adnexa.  COMPARISON:  None  FINDINGS: Uterus  Measurements: 8.0 x 4.1 x 5.7 cm. Retroflexed. No fibroids or other mass visualized.  Endometrium  Thickness: 7 mm. Poorly visualized due to uterine orientation. No focal abnormality visualized.  Right ovary  Measurements: 3.9 x 2.2 x 2.9 cm. 1.9 x 1.7 x 1.7 cm cyst/follicle, simple.  Left ovary  Not discretely visualized transabdominally or transvaginally.  Other findings  Trace pelvic fluid.  IMPRESSION: Endometrial complex measures 7 mm, within normal limits.  1.9 cm right ovarian cyst/follicle, likely physiologic.  Left ovary is  not discretely visualized.  If bleeding remains unresponsive to hormonal or medical therapy, sonohysterogram should be considered for focal lesion work-up. (Ref: Radiological Reasoning: Algorithmic Workup of Abnormal Vaginal Bleeding with Endovaginal Sonography and Sonohysterography. AJR 2008; 540:J81-19; 191:S68-73)   Assessment & Plan:  GYN with PAP AUB- suspect PCOS  F/u PAP with hrHPV STI screen including HIV, RPR  Hep B & C Labs: HgbA1c; FSH, LH, TSH, DHEAS. Testosterone   LoEstrin 1/20 1 po q day F/u in 4 weeks or sooner prn  Jafar Poffenberger L. Harraway-Smith, M.D., [facility] CoreFACOG

## 2016-09-21 NOTE — Progress Notes (Signed)
Pt's LMP was 04/12/2016 and lasted until the last week of May. The period before that lasted for four months.

## 2016-09-22 LAB — TSH: TSH: 1.04 u[IU]/mL (ref 0.450–4.500)

## 2016-09-22 LAB — LUTEINIZING HORMONE: LH: 9.5 m[IU]/mL

## 2016-09-22 LAB — HIV ANTIBODY (ROUTINE TESTING W REFLEX): HIV Screen 4th Generation wRfx: NONREACTIVE

## 2016-09-22 LAB — HEMOGLOBIN A1C
Est. average glucose Bld gHb Est-mCnc: 105 mg/dL
Hgb A1c MFr Bld: 5.3 % (ref 4.8–5.6)

## 2016-09-22 LAB — DHEA-SULFATE: DHEA-SO4: 85.3 ug/dL (ref 84.8–378.0)

## 2016-09-22 LAB — HEPATITIS B SURFACE ANTIGEN: Hepatitis B Surface Ag: NEGATIVE

## 2016-09-22 LAB — RPR: RPR: NONREACTIVE

## 2016-09-22 LAB — TESTOSTERONE: Testosterone: 115 ng/dL — ABNORMAL HIGH (ref 8–48)

## 2016-09-22 LAB — FOLLICLE STIMULATING HORMONE: FSH: 6.5 m[IU]/mL

## 2016-09-22 LAB — HEPATITIS C ANTIBODY: Hep C Virus Ab: <0.1 s/co ratio (ref 0.0–0.9)

## 2016-09-25 ENCOUNTER — Encounter: Payer: Self-pay | Admitting: Obstetrics & Gynecology

## 2016-09-26 LAB — CYTOLOGY - PAP
CHLAMYDIA, DNA PROBE: NEGATIVE
DIAGNOSIS: NEGATIVE
HPV (WINDOPATH): NOT DETECTED
Neisseria Gonorrhea: NEGATIVE

## 2016-10-27 ENCOUNTER — Ambulatory Visit (INDEPENDENT_AMBULATORY_CARE_PROVIDER_SITE_OTHER): Payer: Managed Care, Other (non HMO) | Admitting: Obstetrics & Gynecology

## 2016-10-27 ENCOUNTER — Encounter: Payer: Self-pay | Admitting: Obstetrics & Gynecology

## 2016-10-27 VITALS — BP 122/80 | HR 73 | Ht 62.0 in | Wt 293.0 lb

## 2016-10-27 DIAGNOSIS — G43009 Migraine without aura, not intractable, without status migrainosus: Secondary | ICD-10-CM | POA: Diagnosis not present

## 2016-10-27 DIAGNOSIS — E282 Polycystic ovarian syndrome: Secondary | ICD-10-CM | POA: Diagnosis not present

## 2016-10-27 NOTE — Progress Notes (Signed)
History:  31 y.o. G0P0000 here today for AUB. She started the Loestrin 1/20 finished pack 1 week prev. Did not pick up next Rx because she did not know that she needed to. Pt c/o HA. Pharmacy rec Sudafed and Motrin combo which has been working. Pt reports that the HA have occurred for 3 months. The HA started PRIOR to the birth control pills.   The following portions of the patient's history were reviewed and updated as appropriate: allergies, current medications, past family history, past medical history, past social history, past surgical history and problem list.  Review of Systems:  Pertinent items are noted in HPI.   Objective:  Physical Exam Blood pressure 122/80, pulse 73, height 5\' 2"  (1.575 m), weight 293 lb (132.9 kg), last menstrual period 10/20/2016. CONSTITUTIONAL: Well-developed, well-nourished female in no acute distress.  HENT:  Normocephalic, atraumatic EYES: Conjunctivae and EOM are normal. No scleral icterus.  NECK: Normal range of motion SKIN: Skin is warm and dry. No rash noted. Not diaphoretic.No pallor. NEUROLGIC: Alert and oriented to person, place, and time. Normal coordination.    Assessment & Plan:  Amenorrhea- improved with Loestrin 1/20mg   HA- improved with Sudafed and Motrin Exercise. Pt to get pedometer. Limit juices and sugary coffee. Eliminate fried foods  F/u 3months or sooner prn  Total face-to-face time with patient was 15 min.  Greater than 50% was spent in counseling and coordination of care with the patient.     Alekai Pocock L. Harraway-Smith, M.D., [facility] CoreFACOG

## 2016-10-27 NOTE — Progress Notes (Signed)
Patient complaining of migraine headaches with no relief from Excedrin. Armandina StammerJennifer Howard RNBSN

## 2016-10-27 NOTE — Patient Instructions (Signed)
Diet for Polycystic Ovarian Syndrome Polycystic ovary syndrome (PCOS) is a disorder of the chemical messengers (hormones) that regulate menstruation. The condition causes important hormones to be out of balance. PCOS can:  Make your periods irregular or stop.  Cause cysts to develop on the ovaries.  Make it difficult to get pregnant.  Stop your body from responding to the effects of insulin (insulin resistance), which can lead to obesity and diabetes.  Changing what you eat can help manage PCOS and improve your health. It can help you lose weight and improve the way your body uses insulin. What is my plan?  Eat breakfast, lunch, and dinner plus two snacks every day.  Include protein in each meal and snack.  Choose whole grains instead of products made with refined flour.  Eat a variety of foods.  Exercise regularly as told by your health care provider. What do I need to know about this eating plan? If you are overweight or obese, pay attention to how many calories you eat. Cutting down on calories can help you lose weight. Work with your health care provider or dietitian to figure out how many calories you need each day. What foods can I eat? Grains Whole grains, such as whole wheat. Whole-grain breads, crackers, cereals, and pasta. Unsweetened oatmeal, bulgur, barley, quinoa, or brown rice. Corn or whole-wheat flour tortillas. Vegetables  Lettuce. Spinach. Peas. Beets. Cauliflower. Cabbage. Broccoli. Carrots. Tomatoes. Squash. Eggplant. Herbs. Peppers. Onions. Cucumbers. Brussels sprouts. Fruits Berries. Bananas. Apples. Oranges. Grapes. Papaya. Mango. Pomegranate. Kiwi. Grapefruit. Cherries. Meats and Other Protein Sources Lean proteins, such as fish, chicken, beans, eggs, and tofu. Dairy Low-fat dairy products, such as skim milk, cheese sticks, and yogurt. Beverages Low-fat or fat-free drinks, such as water, low-fat milk, sugar-free drinks, and 100% fruit  juice. Condiments Ketchup. Mustard. Barbecue sauce. Relish. Low-fat or fat-free mayonnaise. Fats and Oils Olive oil or canola oil. Walnuts and almonds. The items listed above may not be a complete list of recommended foods or beverages. Contact your dietitian for more options. What foods are not recommended? Foods high in calories or fat. Fried foods. Sweets. Products made from refined white flour, including white bread, pastries, white rice, and pasta. The items listed above may not be a complete list of foods and beverages to avoid. Contact your dietitian for more information. This information is not intended to replace advice given to you by your health care provider. Make sure you discuss any questions you have with your health care provider. Document Released: 07/19/2015 Document Revised: 09/02/2015 Document Reviewed: 04/08/2014 Elsevier Interactive Patient Education  2018 Elsevier Inc.  Polycystic Ovarian Syndrome Polycystic ovarian syndrome (PCOS) is a common hormonal disorder among women of reproductive age. In most women with PCOS, many small fluid-filled sacs (cysts) grow on the ovaries, and the cysts are not part of a normal menstrual cycle. PCOS can cause problems with your menstrual periods and make it difficult to get pregnant. It can also cause an increased risk of miscarriage with pregnancy. If it is not treated, PCOS can lead to serious health problems, such as diabetes and heart disease. What are the causes? The cause of PCOS is not known, but it may be the result of a combination of certain factors, such as:  Irregular menstrual cycle.  High levels of certain hormones (androgens).  Problems with the hormone that helps to control blood sugar (insulin resistance).  Certain genes.  What increases the risk? This condition is more likely to develop in women who   have a family history of PCOS. What are the signs or symptoms? Symptoms of PCOS may include:  Multiple ovarian  cysts.  Infrequent periods or no periods.  Periods that are too frequent or too heavy.  Unpredictable periods.  Inability to get pregnant (infertility) because of not ovulating.  Increased growth of hair on the face, chest, stomach, back, thumbs, thighs, or toes.  Acne or oily skin. Acne may develop during adulthood, and it may not respond to treatment.  Pelvic pain.  Weight gain or obesity.  Patches of thickened and dark brown or black skin on the neck, arms, breasts, or thighs (acanthosis nigricans).  Excess hair growth on the face, chest, abdomen, or upper thighs (hirsutism).  How is this diagnosed? This condition is diagnosed based on:  Your medical history.  A physical exam, including a pelvic exam. Your health care provider may look for areas of increased hair growth on your skin.  Tests, such as: ? Ultrasound. This may be used to examine the ovaries and the lining of the uterus (endometrium) for cysts. ? Blood tests. These may be used to check levels of sugar (glucose), female hormone (testosterone), and female hormones (estrogen and progesterone) in your blood.  How is this treated? There is no cure for PCOS, but treatment can help to manage symptoms and prevent more health problems from developing. Treatment varies depending on:  Your symptoms.  Whether you want to have a baby or whether you need birth control (contraception).  Treatment may include nutrition and lifestyle changes along with:  Progesterone hormone to start a menstrual period.  Birth control pills to help you have regular menstrual periods.  Medicines to make you ovulate, if you want to get pregnant.  Medicine to reduce excessive hair growth.  Surgery, in severe cases. This may involve making small holes in one or both of your ovaries. This decreases the amount of testosterone that your body produces.  Follow these instructions at home:  Take over-the-counter and prescription medicines only  as told by your health care provider.  Follow a healthy meal plan. This can help you reduce the effects of PCOS. ? Eat a healthy diet that includes lean proteins, complex carbohydrates, fresh fruits and vegetables, low-fat dairy products, and healthy fats. Make sure to eat enough fiber.  If you are overweight, lose weight as told by your health care provider. ? Losing 10% of your body weight may improve symptoms. ? Your health care provider can determine how much weight loss is best for you and can help you lose weight safely.  Keep all follow-up visits as told by your health care provider. This is important. Contact a health care provider if:  Your symptoms do not get better with medicine.  You develop new symptoms. This information is not intended to replace advice given to you by your health care provider. Make sure you discuss any questions you have with your health care provider. Document Released: 07/21/2004 Document Revised: 11/23/2015 Document Reviewed: 09/12/2015 Elsevier Interactive Patient Education  2018 Elsevier Inc.  

## 2017-02-05 ENCOUNTER — Encounter: Payer: Self-pay | Admitting: Obstetrics & Gynecology

## 2017-02-19 ENCOUNTER — Emergency Department (HOSPITAL_BASED_OUTPATIENT_CLINIC_OR_DEPARTMENT_OTHER)
Admission: EM | Admit: 2017-02-19 | Discharge: 2017-02-19 | Disposition: A | Discharge status: Home / Self Care | Payer: Managed Care, Other (non HMO) | Attending: Emergency Medicine | Admitting: Emergency Medicine

## 2017-02-19 ENCOUNTER — Other Ambulatory Visit: Payer: Self-pay

## 2017-02-19 ENCOUNTER — Encounter (HOSPITAL_BASED_OUTPATIENT_CLINIC_OR_DEPARTMENT_OTHER): Payer: Self-pay | Admitting: Emergency Medicine

## 2017-02-19 DIAGNOSIS — Y9389 Activity, other specified: Secondary | ICD-10-CM | POA: Insufficient documentation

## 2017-02-19 DIAGNOSIS — Z79899 Other long term (current) drug therapy: Secondary | ICD-10-CM | POA: Insufficient documentation

## 2017-02-19 DIAGNOSIS — Y929 Unspecified place or not applicable: Secondary | ICD-10-CM | POA: Insufficient documentation

## 2017-02-19 DIAGNOSIS — Y999 Unspecified external cause status: Secondary | ICD-10-CM | POA: Insufficient documentation

## 2017-02-19 DIAGNOSIS — M7918 Myalgia, other site: Secondary | ICD-10-CM

## 2017-02-19 DIAGNOSIS — F17210 Nicotine dependence, cigarettes, uncomplicated: Secondary | ICD-10-CM | POA: Insufficient documentation

## 2017-02-19 DIAGNOSIS — M542 Cervicalgia: Secondary | ICD-10-CM | POA: Insufficient documentation

## 2017-02-19 DIAGNOSIS — M545 Low back pain: Secondary | ICD-10-CM | POA: Insufficient documentation

## 2017-02-19 MED ORDER — CYCLOBENZAPRINE HCL 10 MG PO TABS: 10.0000 mg | ORAL_TABLET | Freq: Every evening | ORAL | 0 refills | Status: DC | PRN

## 2017-02-19 MED ORDER — IBUPROFEN 400 MG PO TABS
600.0000 mg | ORAL_TABLET | Freq: Once | ORAL | Status: AC
  Administered 2017-02-19: 600 mg via ORAL
  Filled 2017-02-19: qty 1

## 2017-02-19 MED FILL — CYCLOBENZAPRINE 10 MG TAB: 10 | 10 days supply | Qty: 10 | Fill #0

## 2017-02-19 NOTE — Discharge Instructions (Addendum)
You are being prescribed a medication which may make you sleepy. For 24 hours after one dose please do not drive, operate heavy machinery, care for a small child with out another adult present, or perform any activities that may cause harm to you or someone else if you were to fall asleep or be impaired.   The best way to get rid of muscle pain is by taking NSAIDS, using heat, massage therapy, and gentle stretching/range of motion exercises.  Do not fall asleep using a heat pad or use heat on an area where you do not have full feeling as this can cause severe burns.   Please take Ibuprofen (Advil, motrin) and Tylenol (acetaminophen) to relieve your pain.  You may take up to 600 MG (3 pills) of normal strength ibuprofen every 8 hours as needed.  In between doses of ibuprofen you make take tylenol, up to 1,000 mg (two extra strength pills).  Do not take more than 3,000 mg tylenol in a 24 hour period.  Please check all medication labels as many medications such as pain and cold medications may contain tylenol.  Do not drink alcohol while taking these medications.  Do not take other NSAID'S while taking ibuprofen (such as aleve or naproxen).  Please take ibuprofen with food to decrease stomach upset.
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
## 2017-03-05 ENCOUNTER — Encounter: Payer: Self-pay | Admitting: Obstetrics & Gynecology

## 2017-03-06 ENCOUNTER — Encounter: Payer: Self-pay | Admitting: Obstetrics & Gynecology

## 2017-03-07 ENCOUNTER — Other Ambulatory Visit: Payer: Self-pay

## 2017-06-14 ENCOUNTER — Encounter: Payer: Self-pay | Admitting: Obstetrics & Gynecology

## 2017-06-15 ENCOUNTER — Encounter: Payer: Self-pay | Admitting: Obstetrics & Gynecology

## 2017-06-18 ENCOUNTER — Ambulatory Visit (INDEPENDENT_AMBULATORY_CARE_PROVIDER_SITE_OTHER): Payer: BLUE CROSS/BLUE SHIELD | Admitting: Obstetrics & Gynecology

## 2017-06-18 ENCOUNTER — Encounter: Payer: Self-pay | Admitting: Obstetrics & Gynecology

## 2017-06-18 VITALS — BP 119/75 | HR 74 | Ht 65.0 in | Wt 312.0 lb

## 2017-06-18 DIAGNOSIS — N898 Other specified noninflammatory disorders of vagina: Secondary | ICD-10-CM | POA: Diagnosis not present

## 2017-06-18 DIAGNOSIS — E669 Obesity, unspecified: Secondary | ICD-10-CM

## 2017-06-18 DIAGNOSIS — Z113 Encounter for screening for infections with a predominantly sexual mode of transmission: Secondary | ICD-10-CM

## 2017-06-18 NOTE — Progress Notes (Signed)
History:  32 y.o. G0P0000 here today for eval of vaginal discharge. She reports no itching or burning but, a watery type discharge. She denies odor.   Pt is also concerned about her inability to lose weight. She reports watching what she eats and exercises (cardio only).    The following portions of the patient's history were reviewed and updated as appropriate: allergies, current medications, past family history, past medical history, past social history, past surgical history and problem list.  Review of Systems:  Pertinent items are noted in HPI.   Objective:  Physical Exam Blood pressure 119/75, pulse 74, height 5\' 5"  (1.651 m), weight (!) 312 lb (141.5 kg).  CONSTITUTIONAL: Well-developed, well-nourished female in no acute distress.  HENT:  Normocephalic, atraumatic EYES: Conjunctivae and EOM are normal. No scleral icterus.  NECK: Normal range of motion SKIN: Skin is warm and dry. No rash noted. Not diaphoretic.No pallor. NEUROLGIC: Alert and oriented to person, place, and time. Normal coordination.  Pelvic: Normal appearing external genitalia; normal appearing vaginal mucosa and cervix.  Normal discharge.  No CMT. No odor appreciated.     Assessment & Plan:  Vaginal discharge  S/p Affirm to eval  Weight management  Reviewed strength training- pointed pt to online videos as she does not have have a GYM  membership.  Recommended Weight watchers and [email or web address] as resources  Pt to f/u in 6 weeks for weight check  Total face-to-face time with patient was 20 min.  Greater than 50% was spent in counseling and coordination of care with the patient.  Marland Kitchen.  .Marland Kitchen

## 2017-06-18 NOTE — Patient Instructions (Signed)
[email or web address]  On line videos for strength training for women   Exercising to Lose Weight Exercising can help you to lose weight. In order to lose weight through exercise, you need to do vigorous-intensity exercise. You can tell that you are exercising with vigorous intensity if you are breathing very hard and fast and cannot hold a conversation while exercising. Moderate-intensity exercise helps to maintain your current weight. You can tell that you are exercising at a moderate level if you have a higher heart rate and faster breathing, but you are still able to hold a conversation. How often should I exercise? Choose an activity that you enjoy and set realistic goals. Your health care provider can help you to make an activity plan that works for you. Exercise regularly as directed by your health care provider. This may include:  Doing resistance training twice each week, such as: ? Push-ups. ? Sit-ups. ? Lifting weights. ? Using resistance bands.  Doing a given intensity of exercise for a given amount of time. Choose from these options: ? 150 minutes of moderate-intensity exercise every week. ? 75 minutes of vigorous-intensity exercise every week. ? A mix of moderate-intensity and vigorous-intensity exercise every week.  Children, pregnant women, people who are out of shape, people who are overweight, and older adults may need to consult a health care provider for individual recommendations. If you have any sort of medical condition, be sure to consult your health care provider before starting a new exercise program. What are some activities that can help me to lose weight?  Walking at a rate of at least 4.5 miles an hour.  Jogging or running at a rate of 5 miles per hour.  Biking at a rate of at least 10 miles per hour.  Lap swimming.  Roller-skating or in-line skating.  Cross-country skiing.  Vigorous competitive sports, such as football, basketball,  and soccer.  Jumping rope.  Aerobic dancing. How can I be more active in my day-to-day activities?  Use the stairs instead of the elevator.  Take a walk during your lunch break.  If you drive, park your car farther away from work or school.  If you take public transportation, get off one stop early and walk the rest of the way.  Make all of your phone calls while standing up and walking around.  Get up, stretch, and walk around every 30 minutes throughout the day. What guidelines should I follow while exercising?  Do not exercise so much that you hurt yourself, feel dizzy, or get very short of breath.  Consult your health care provider prior to starting a new exercise program.  Wear comfortable clothes and shoes with good support.  Drink plenty of water while you exercise to prevent dehydration or heat stroke. Body water is lost during exercise and must be replaced.  Work out until you breathe faster and your heart beats faster. This information is not intended to replace advice given to you by your health care provider. Make sure you discuss any questions you have with your health care provider. Document Released: 04/29/2010 Document Revised: 09/02/2015 Document Reviewed: 08/28/2013 Elsevier Interactive Patient Education  [employer]2018 Elsevier Inc.

## 2017-06-21 LAB — CERVICOVAGINAL ANCILLARY ONLY
BACTERIAL VAGINITIS: POSITIVE — AB
CANDIDA VAGINITIS: NEGATIVE

## 2017-06-22 ENCOUNTER — Other Ambulatory Visit: Payer: Self-pay | Admitting: Obstetrics & Gynecology

## 2017-06-22 DIAGNOSIS — N760 Acute vaginitis: Principal | ICD-10-CM

## 2017-06-22 DIAGNOSIS — B9689 Other specified bacterial agents as the cause of diseases classified elsewhere: Secondary | ICD-10-CM

## 2017-06-22 MED ORDER — METRONIDAZOLE 500 MG PO TABS
500.0000 mg | ORAL_TABLET | Freq: Two times a day (BID) | ORAL | 0 refills | Status: DC
Start: 2017-06-22 — End: 2017-09-05

## 2017-06-28 ENCOUNTER — Encounter: Payer: Self-pay | Admitting: Obstetrics & Gynecology

## 2017-06-29 ENCOUNTER — Encounter: Payer: Self-pay | Admitting: Obstetrics & Gynecology

## 2017-07-02 ENCOUNTER — Encounter: Payer: Self-pay | Admitting: Obstetrics & Gynecology

## 2017-07-30 ENCOUNTER — Encounter: Payer: Self-pay | Admitting: Obstetrics & Gynecology

## 2017-07-30 ENCOUNTER — Encounter (INDEPENDENT_AMBULATORY_CARE_PROVIDER_SITE_OTHER): Payer: Self-pay

## 2017-07-30 ENCOUNTER — Ambulatory Visit: Payer: Self-pay | Admitting: Obstetrics & Gynecology

## 2017-07-30 DIAGNOSIS — Z09 Encounter for follow-up examination after completed treatment for conditions other than malignant neoplasm: Secondary | ICD-10-CM

## 2017-08-02 ENCOUNTER — Ambulatory Visit: Payer: Self-pay | Admitting: Obstetrics & Gynecology

## 2017-08-02 DIAGNOSIS — Z09 Encounter for follow-up examination after completed treatment for conditions other than malignant neoplasm: Secondary | ICD-10-CM

## 2017-09-05 ENCOUNTER — Encounter (HOSPITAL_BASED_OUTPATIENT_CLINIC_OR_DEPARTMENT_OTHER): Payer: Self-pay | Admitting: Adult Health

## 2017-09-05 ENCOUNTER — Emergency Department (HOSPITAL_BASED_OUTPATIENT_CLINIC_OR_DEPARTMENT_OTHER)
Admission: EM | Admit: 2017-09-05 | Discharge: 2017-09-05 | Disposition: A | Discharge status: Home / Self Care | Payer: 59 | Attending: Emergency Medicine | Admitting: Emergency Medicine

## 2017-09-05 ENCOUNTER — Emergency Department (HOSPITAL_BASED_OUTPATIENT_CLINIC_OR_DEPARTMENT_OTHER): Payer: 59

## 2017-09-05 ENCOUNTER — Other Ambulatory Visit: Payer: Self-pay

## 2017-09-05 DIAGNOSIS — F17290 Nicotine dependence, other tobacco product, uncomplicated: Secondary | ICD-10-CM | POA: Insufficient documentation

## 2017-09-05 DIAGNOSIS — R509 Fever, unspecified: Secondary | ICD-10-CM | POA: Diagnosis present

## 2017-09-05 DIAGNOSIS — J069 Acute upper respiratory infection, unspecified: Secondary | ICD-10-CM | POA: Diagnosis not present

## 2017-09-05 DIAGNOSIS — R070 Pain in throat: Secondary | ICD-10-CM | POA: Insufficient documentation

## 2017-09-05 DIAGNOSIS — R1110 Vomiting, unspecified: Secondary | ICD-10-CM | POA: Diagnosis not present

## 2017-09-05 DIAGNOSIS — R5383 Other fatigue: Secondary | ICD-10-CM | POA: Insufficient documentation

## 2017-09-05 DIAGNOSIS — R05 Cough: Secondary | ICD-10-CM | POA: Insufficient documentation

## 2017-09-05 DIAGNOSIS — M791 Myalgia, unspecified site: Secondary | ICD-10-CM | POA: Diagnosis not present

## 2017-09-05 LAB — CBC WITH DIFFERENTIAL/PLATELET
BASOS PCT: 0 %
Basophils Absolute: 0 10*3/uL (ref 0.0–0.1)
Eosinophils Absolute: 0.1 10*3/uL (ref 0.0–0.7)
Eosinophils Relative: 1 %
HEMATOCRIT: 36.2 % (ref 36.0–46.0)
HEMOGLOBIN: 12.3 g/dL (ref 12.0–15.0)
LYMPHS ABS: 3.5 10*3/uL (ref 0.7–4.0)
Lymphocytes Relative: 19 %
MCH: 27.5 pg (ref 26.0–34.0)
MCHC: 34 g/dL (ref 30.0–36.0)
MCV: 80.8 fL (ref 78.0–100.0)
MONOS PCT: 6 %
Monocytes Absolute: 1.1 10*3/uL — ABNORMAL HIGH (ref 0.1–1.0)
NEUTROS ABS: 13.1 10*3/uL — AB (ref 1.7–7.7)
NEUTROS PCT: 74 %
Platelets: 321 10*3/uL (ref 150–400)
RBC: 4.48 MIL/uL (ref 3.87–5.11)
RDW: 15 % (ref 11.5–15.5)
WBC: 17.9 10*3/uL — ABNORMAL HIGH (ref 4.0–10.5)

## 2017-09-05 LAB — COMPREHENSIVE METABOLIC PANEL
ALT: 16 U/L (ref 14–54)
ANION GAP: 9 (ref 5–15)
AST: 46 U/L — ABNORMAL HIGH (ref 15–41)
Albumin: 3.8 g/dL (ref 3.5–5.0)
Alkaline Phosphatase: 52 U/L (ref 38–126)
BUN: 10 mg/dL (ref 6–20)
CHLORIDE: 105 mmol/L (ref 101–111)
CO2: 22 mmol/L (ref 22–32)
Calcium: 8.7 mg/dL — ABNORMAL LOW (ref 8.9–10.3)
Creatinine, Ser: 0.89 mg/dL (ref 0.44–1.00)
GFR calc non Af Amer: >60 mL/min (ref >60)
Glucose, Bld: 88 mg/dL (ref 65–99)
Potassium: 5.7 mmol/L — ABNORMAL HIGH (ref 3.5–5.1)
SODIUM: 136 mmol/L (ref 135–145)
Total Bilirubin: 1.5 mg/dL — ABNORMAL HIGH (ref 0.3–1.2)
Total Protein: 7.5 g/dL (ref 6.5–8.1)

## 2017-09-05 LAB — RAPID STREP SCREEN (MED CTR MEBANE ONLY): Streptococcus, Group A Screen (Direct): NEGATIVE

## 2017-09-05 LAB — URINALYSIS, ROUTINE W REFLEX MICROSCOPIC
BILIRUBIN URINE: NEGATIVE
Glucose, UA: NEGATIVE mg/dL
Ketones, ur: NEGATIVE mg/dL
Leukocytes, UA: NEGATIVE
NITRITE: NEGATIVE
Protein, ur: NEGATIVE mg/dL
Specific Gravity, Urine: 1.02 (ref 1.005–1.030)
pH: 8 (ref 5.0–8.0)

## 2017-09-05 LAB — PREGNANCY, URINE: Preg Test, Ur: NEGATIVE

## 2017-09-05 LAB — I-STAT CG4 LACTIC ACID, ED: LACTIC ACID, VENOUS: 1.83 mmol/L (ref 0.5–1.9)

## 2017-09-05 LAB — URINALYSIS, MICROSCOPIC (REFLEX): WBC UA: NONE SEEN WBC/hpf (ref 0–5)

## 2017-09-05 MED ORDER — BENZONATATE 100 MG PO CAPS: 100.0000 mg | ORAL_CAPSULE | Freq: Three times a day (TID) | ORAL | 0 refills | Status: DC

## 2017-09-05 MED ORDER — KETOROLAC TROMETHAMINE 30 MG/ML IJ SOLN
30.0000 mg | Freq: Once | INTRAMUSCULAR | Status: DC
  Filled 2017-09-05: qty 1

## 2017-09-05 MED ORDER — AZITHROMYCIN 250 MG PO TABS: 250.0000 mg | ORAL_TABLET | Freq: Every day | ORAL | 0 refills | Status: DC

## 2017-09-05 MED ORDER — ONDANSETRON 4 MG PO TBDP
4.0000 mg | ORAL_TABLET | Freq: Once | ORAL | Status: AC
  Administered 2017-09-05: 4 mg via ORAL
  Filled 2017-09-05: qty 1

## 2017-09-05 MED ORDER — ACETAMINOPHEN 325 MG PO TABS
650.0000 mg | ORAL_TABLET | Freq: Once | ORAL | Status: AC | PRN
  Administered 2017-09-05: 650 mg via ORAL
  Filled 2017-09-05: qty 2

## 2017-09-05 MED ORDER — SODIUM CHLORIDE 0.9 % IV BOLUS
1000.0000 mL | Freq: Once | INTRAVENOUS | Status: DC
Start: 2017-09-05 — End: 2017-09-05

## 2017-09-05 MED ORDER — KETOROLAC TROMETHAMINE 30 MG/ML IJ SOLN
30.0000 mg | Freq: Once | INTRAMUSCULAR | Status: AC
  Administered 2017-09-05: 30 mg via INTRAMUSCULAR

## 2017-09-05 MED ORDER — IBUPROFEN 800 MG PO TABS: 800.0000 mg | ORAL_TABLET | Freq: Three times a day (TID) | ORAL | 0 refills | Status: DC | PRN

## 2017-09-05 MED ORDER — ONDANSETRON HCL 4 MG/2ML IJ SOLN
4.0000 mg | Freq: Once | INTRAMUSCULAR | Status: DC
  Filled 2017-09-05: qty 2

## 2017-09-05 NOTE — ED Provider Notes (Signed)
Emergency Department Provider Note   I have reviewed the triage vital signs and the nursing notes.   HISTORY  Chief Complaint Fever   HPI Maria Mccarthy is a 32 y.o. female resents to the emergency department for evaluation of body aches, fatigue, cough, sore throat, with associated fever.  The patient states she is seen some streaks of blood in her productive cough and also had some streaks of bright red blood and an episode of emesis today.  Symptoms began while she was out of town at [facility] and have worsened over the past 2 days.  No known sick contacts.  She denies associated diarrhea.  She describes diffuse abdominal discomfort but nothing unilateral or focal.  She denies any dysuria, hesitancy, urgency.   History reviewed. No pertinent past medical history.  Patient Active Problem List   Diagnosis Date Noted  . DUB (dysfunctional uterine bleeding) 09/11/2013    Past Surgical History:  Procedure Laterality Date  . NO PAST SURGERIES      Current Outpatient Rx  . Order #: [id number] Class: Historical Med  . Order #: [id number] Class: Print  . Order #: [id number] Class: Print  . Order #: [id number] Class: Print  . Order #: [id number] Class: Normal  . Order #: [id number] Class: Normal    Allergies Patient has no known allergies.  Family History  Problem Relation Age of Onset  . Mental illness Mother   . Diabetes Maternal Grandmother   . Hypertension Maternal Grandmother   . Diabetes Maternal Grandfather   . Diabetes Paternal Grandfather     Social History Social History   Tobacco Use  . Smoking status: Current Every Day Smoker    Packs/day: 0.25    Types: E-cigarettes  . Smokeless tobacco: Never Used  Substance Use Topics  . Alcohol use: Yes    Comment: Occ  . Drug use: No    Review of Systems  Constitutional: Positive fever/chills and body aches.  Eyes: No visual changes. ENT: No sore throat. Cardiovascular: Denies chest pain. Respiratory:  Positive shortness of breath and cough with runny nose. Positive streaks of blood in sputum.  Gastrointestinal: No abdominal pain.  No nausea, no vomiting.  No diarrhea.  No constipation. Genitourinary: Negative for dysuria. Musculoskeletal: Negative for back pain. Skin: Negative for rash. Neurological: Negative for focal weakness or numbness. Positive HA.   10-point ROS otherwise negative.  ____________________________________________   PHYSICAL EXAM:  VITAL SIGNS: ED Triage Vitals  Enc Vitals Group     BP 09/05/17 2001 (!) 145/88     Pulse Rate 09/05/17 2001 (!) 105     Resp 09/05/17 2001 (!) 24     Temp 09/05/17 2001 (!) 101.7 F (38.7 C)     Temp Source 09/05/17 2001 Oral     SpO2 09/05/17 2001 98 %     Weight 09/05/17 2002 (!) 313 lb (142 kg)     Height 09/05/17 2002  (1.626 m)     Pain Score 09/05/17 2001 8   Constitutional: Alert and oriented. Patient appears uncomfortable on arrival but speaking in full sentences and able to provide a history.  Eyes: Conjunctivae are normal.  Head: Atraumatic. Ears:  Healthy appearing ear canals and TMs bilaterally Nose: Positive congestion/rhinnorhea. Mouth/Throat: Mucous membranes are moist.  Oropharynx with mild erythema. No exudate. No PTA. Patient speaking in a clear voice and managing oral secretions.  Neck: No stridor.  No meningeal signs. Cardiovascular: Sinus tachycardia. Good peripheral circulation. Grossly normal heart sounds.   Respiratory: Normal respiratory  effort.  No retractions. Lungs CTAB. Gastrointestinal: Soft and nontender. No distention.  Musculoskeletal: No lower extremity tenderness nor edema. No gross deformities of extremities. Neurologic:  Normal speech and language. No gross focal neurologic deficits are appreciated.  Skin:  Skin is warm, dry and intact. No rash noted.   ____________________________________________   LABS (all labs ordered are listed, but only abnormal results are  displayed)  Labs Reviewed  COMPREHENSIVE METABOLIC PANEL - Abnormal; Notable for the following components:      Result Value   Potassium 5.7 (*)    Calcium 8.7 (*)    AST 46 (*)    Total Bilirubin 1.5 (*)    All other components within normal limits  CBC WITH DIFFERENTIAL/PLATELET - Abnormal; Notable for the following components:   WBC 17.9 (*)    Neutro Abs 13.1 (*)    Monocytes Absolute 1.1 (*)    All other components within normal limits  URINALYSIS, ROUTINE W REFLEX MICROSCOPIC - Abnormal; Notable for the following components:   Hgb urine dipstick TRACE (*)    All other components within normal limits  URINALYSIS, MICROSCOPIC (REFLEX) - Abnormal; Notable for the following components:   Bacteria, UA RARE (*)    All other components within normal limits  RAPID STREP SCREEN ([facility] & [facility] ONLY)  CULTURE, GROUP A STREP [facility])  PREGNANCY, URINE  I-STAT CG4 LACTIC ACID, ED   ____________________________________________  EKG   EKG Interpretation  Date/Time:  Wednesday Sep 05 2017 20:51:20 EDT Ventricular Rate:  90 PR Interval:    QRS Duration: 88 QT Interval:  355 QTC Calculation: 435 R Axis:   28 Text Interpretation:  Sinus rhythm Baseline wander in lead(s) V6 No STEMI.  Confirmed by Alona Bene [phone]) on 09/05/2017 10:39:14 PM       ____________________________________________  RADIOLOGY  Dg Chest 2 View  Result Date: 09/05/2017 CLINICAL DATA:  Hemoptysis. EXAM: CHEST - 2 VIEW COMPARISON:  None. FINDINGS: Significant scoliotic curvature to the thoracic spine, apex to the right. No pneumothorax. The heart size is borderline. The right hilum is obscured by the overlapping spine but grossly unremarkable. The left hilum and mediastinum are normal. There is haziness over the right chest, likely due to positioning. No focal infiltrate, nodule, or mass. IMPRESSION: Scoliosis. Haziness over the right chest is likely positional. No definitive infiltrate or mass to explain the  patient's symptoms. Electronically Signed   By: Gerome Sam III M.D   On: 09/05/2017 20:40    ____________________________________________   PROCEDURES  Procedure(s) performed:   Procedures  None ____________________________________________   INITIAL IMPRESSION / ASSESSMENT AND PLAN / ED COURSE  Pertinent labs & imaging results that were available during my care of the patient were reviewed by me and considered in my medical decision making (see chart for details).  Patient presents to the emergency department with flulike symptoms over the past 2 days.  Her abdomen is diffusely soft and nontender to palpation.  Indication for abdominal imaging.  Chest x-ray reviewed with no acute findings.  Labs pending.  Added cultures given tachycardia, tachypnea, fever here.   Patient with haziness over the right chest not favored to be PNA but given clinical symptoms this is a possiblity vs bronchitis. Plan to cover with Azithromycin, Tessalon, and Motrin. Patient labs reviewed with no acute findings. Cancelled d-dimer with fever and infection symptoms to explain mild hemoptysis.   At this time, I do not feel there is any life-threatening condition present. I have reviewed and discussed  all results (EKG, imaging, lab, urine as appropriate), exam findings with patient. I have reviewed nursing notes and appropriate previous records.  I feel the patient is safe to be discharged home without further emergent workup. Discussed usual and customary return precautions. Patient and family (if present) verbalize understanding and are comfortable with this plan.  Patient will follow-up with their primary care provider. If they do not have a primary care provider, information for follow-up has been provided to them. All questions have been answered.  ____________________________________________  FINAL CLINICAL IMPRESSION(S) / ED DIAGNOSES  Final diagnoses:  Upper respiratory tract infection, unspecified  type     MEDICATIONS GIVEN DURING THIS VISIT:  Medications  acetaminophen (TYLENOL) tablet 650 mg (650 mg Oral Given 09/05/17 2011)  ketorolac (TORADOL) 30 MG/ML injection 30 mg (30 mg Intramuscular Given 09/05/17 2119)  ondansetron (ZOFRAN-ODT) disintegrating tablet 4 mg (4 mg Oral Given 09/05/17 2119)     NEW OUTPATIENT MEDICATIONS STARTED DURING THIS VISIT:  Discharge Medication List as of 09/05/2017 11:08 PM    START taking these medications   Details  azithromycin (ZITHROMAX) 250 MG tablet Take 1 tablet (250 mg total) by mouth daily. Take first 2 tablets together, then 1 every day until finished., Starting Wed 09/05/2017, Print    benzonatate (TESSALON) 100 MG capsule Take 1 capsule (100 mg total) by mouth every 8 (eight) hours., Starting Wed 09/05/2017, Print    ibuprofen (ADVIL,MOTRIN) 800 MG tablet Take 1 tablet (800 mg total) by mouth every 8 (eight) hours as needed., Starting Wed 09/05/2017, Print        Note:  This document was prepared using Dragon voice recognition software and may include unintentional dictation errors.  Alona Bene, MD Emergency Medicine    Maimuna Leaman, Arlyss Repress, MD 09/06/17 [phone]

## 2017-09-05 NOTE — Discharge Instructions (Signed)

## 2017-09-05 NOTE — ED Notes (Signed)
Attempted 2 IV sticks without success 

## 2017-09-05 NOTE — ED Triage Notes (Signed)
Presents with hemoptysis, body aches, fatigue, and generalized body aches. All of this began today. She has not taken any medication prior to arrival. Fever here 101.7

## 2017-09-05 NOTE — ED Notes (Signed)
Attempted IV in right FA unsuccessful.  

## 2017-09-08 LAB — CULTURE, GROUP A STREP (THRC)

## 2017-09-10 ENCOUNTER — Other Ambulatory Visit: Payer: Self-pay

## 2017-09-10 ENCOUNTER — Emergency Department (HOSPITAL_BASED_OUTPATIENT_CLINIC_OR_DEPARTMENT_OTHER)
Admission: EM | Admit: 2017-09-10 | Discharge: 2017-09-10 | Disposition: A | Discharge status: Home / Self Care | Payer: 59 | Attending: Emergency Medicine | Admitting: Emergency Medicine

## 2017-09-10 ENCOUNTER — Encounter (HOSPITAL_BASED_OUTPATIENT_CLINIC_OR_DEPARTMENT_OTHER): Payer: Self-pay | Admitting: Emergency Medicine

## 2017-09-10 ENCOUNTER — Emergency Department (HOSPITAL_BASED_OUTPATIENT_CLINIC_OR_DEPARTMENT_OTHER): Payer: 59

## 2017-09-10 DIAGNOSIS — B9789 Other viral agents as the cause of diseases classified elsewhere: Secondary | ICD-10-CM | POA: Insufficient documentation

## 2017-09-10 DIAGNOSIS — J069 Acute upper respiratory infection, unspecified: Secondary | ICD-10-CM | POA: Diagnosis not present

## 2017-09-10 DIAGNOSIS — R05 Cough: Secondary | ICD-10-CM | POA: Diagnosis present

## 2017-09-10 DIAGNOSIS — F17210 Nicotine dependence, cigarettes, uncomplicated: Secondary | ICD-10-CM | POA: Insufficient documentation

## 2017-09-10 DIAGNOSIS — R112 Nausea with vomiting, unspecified: Secondary | ICD-10-CM

## 2017-09-10 MED ORDER — ONDANSETRON 4 MG PO TBDP: 4.0000 mg | ORAL_TABLET | Freq: Three times a day (TID) | ORAL | 0 refills | Status: DC | PRN

## 2017-09-10 MED ORDER — ALBUTEROL SULFATE HFA 108 (90 BASE) MCG/ACT IN AERS
2.0000 | INHALATION_SPRAY | Freq: Once | RESPIRATORY_TRACT | Status: AC
  Administered 2017-09-10: 2 via RESPIRATORY_TRACT
  Filled 2017-09-10: qty 6.7

## 2017-09-10 MED ORDER — ALBUTEROL SULFATE HFA 108 (90 BASE) MCG/ACT IN AERS: 1.0000 | INHALATION_SPRAY | Freq: Four times a day (QID) | RESPIRATORY_TRACT | 0 refills | Status: DC | PRN

## 2017-09-10 MED ORDER — ONDANSETRON 4 MG PO TBDP
4.0000 mg | ORAL_TABLET | Freq: Once | ORAL | Status: AC
  Administered 2017-09-10: 4 mg via ORAL
  Filled 2017-09-10: qty 1

## 2017-09-10 MED FILL — ONDANSETRON ODT 4 MG TABLET: 4 | 6 days supply | Qty: 20 | Fill #0

## 2017-09-10 NOTE — Discharge Instructions (Signed)

## 2017-09-10 NOTE — ED Triage Notes (Signed)
Patient states that she was here a few days ago for a "lung infection" - the patient states that her coughing is worse, the cough medications are not working. The patient states that the OTC medications that were recommended has not helped. The patient states that her throat hurts, and she is having night sweats and she is vomiting up what she is eating and drinking

## 2017-09-10 NOTE — ED Provider Notes (Signed)
Emergency Department Provider Note   I have reviewed the triage vital signs and the nursing notes.   HISTORY  Chief Complaint Cough   HPI Maria Mccarthy is a 32 y.o. female with PMH of DUB presents to the ED for re-evaluaCleotis Lemation of flu-like symptoms. Patient complains of cough and post-tussive emesis. She has had subjective fever. She notes that her cough is worse at night. No chest pain but does feel some congestion. No sick contacts. She was seen in the ED several days ago with labs, rapid strep/culture, and CXR with no acute findings. She has been taking Tessalon with no relief in symptoms. No radiation of symptoms or modifying factors. No diarrhea. No hemoptysis.    History reviewed. No pertinent past medical history.  Patient Active Problem List   Diagnosis Date Noted  . DUB (dysfunctional uterine bleeding) 09/11/2013    Past Surgical History:  Procedure Laterality Date  . NO PAST SURGERIES      Current Outpatient Rx  . Order #: [id number] Class: Historical Med  . Order #: [id number] Class: Print  . Order #: [id number] Class: Print  . Order #: [id number] Class: Print  . Order #: [id number] Class: Print  . Order #: [id number] Class: Normal  . Order #: [id number] Class: Normal  . Order #: [id number] Class: Print    Allergies Patient has no known allergies.  Family History  Problem Relation Age of Onset  . Mental illness Mother   . Diabetes Maternal Grandmother   . Hypertension Maternal Grandmother   . Diabetes Maternal Grandfather   . Diabetes Paternal Grandfather     Social History Social History   Tobacco Use  . Smoking status: Current Every Day Smoker    Packs/day: 0.25    Types: E-cigarettes  . Smokeless tobacco: Never Used  Substance Use Topics  . Alcohol use: Yes    Comment: Occ  . Drug use: No    Review of Systems  Constitutional: No fever/chills. Positive body aches.  Eyes: No visual changes. ENT: No sore throat. Positive nasal congestion.    Cardiovascular: Denies chest pain. Respiratory: Denies shortness of breath. Positive cough.  Gastrointestinal: No abdominal pain.  No nausea, no vomiting.  No diarrhea.  No constipation. Genitourinary: Negative for dysuria. Musculoskeletal: Negative for back pain. Skin: Negative for rash. Neurological: Negative for headaches, focal weakness or numbness.  10-point ROS otherwise negative.  ____________________________________________   PHYSICAL EXAM:  VITAL SIGNS: ED Triage Vitals  Enc Vitals Group     BP 09/10/17 1141 (!) 156/103     Pulse Rate 09/10/17 1141 83     Resp 09/10/17 1141 18     Temp 09/10/17 1141 98.1 F (36.7 C)     Temp Source 09/10/17 1141 Oral     SpO2 09/10/17 1141 98 %     Weight 09/10/17 1145 (!) 313 lb (142 kg)     Height 09/10/17 1145 5\' 4"  (1.626 m)     Pain Score 09/10/17 1145 7   Constitutional: Alert and oriented. Well appearing and in no acute distress. Eyes: Conjunctivae are normal.  Head: Atraumatic. Nose: No congestion/rhinnorhea. Mouth/Throat: Mucous membranes are moist.  Oropharynx non-erythematous. Neck: No stridor.  No meningeal signs.   Cardiovascular: Normal rate, regular rhythm. Good peripheral circulation. Grossly normal heart sounds.   Respiratory: Normal respiratory effort.  No retractions. Lungs CTAB. Gastrointestinal: Soft and nontender. No distention.  Musculoskeletal: No lower extremity tenderness nor edema. No gross deformities of extremities. Neurologic:  Normal speech and language. No gross focal neurologic deficits are appreciated.  Skin:  Skin is warm, dry and intact. No rash noted.  ____________________________________________  RADIOLOGY  Dg Chest 2 View  Result Date: 09/10/2017 CLINICAL DATA:  Worsening cough EXAM: CHEST - 2 VIEW COMPARISON:  09/05/2017 FINDINGS: Haziness of the lower chest is symmetric and best attributed to soft tissue attenuation. There is no edema, air bronchogram, effusion, or pneumothorax. Normal  heart size and mediastinal contours. Thoracic scoliosis. IMPRESSION: Negative for pneumonia. Electronically Signed   By: Marnee Spring M.D.   On: 09/10/2017 12:03    ____________________________________________   PROCEDURES  Procedure(s) performed:   Procedures  None ____________________________________________   INITIAL IMPRESSION / ASSESSMENT AND PLAN / ED COURSE  Pertinent labs & imaging results that were available during my care of the patient were reviewed by me and considered in my medical decision making (see chart for details).  Patient presents to the ED with viral type symptoms. Afebrile here with normal vitals. Exam is non-specific. No abdominal tenderness to require abdominal imaging. Patient appears well-hydrated. No concern for serious bacterial infection or meningitis. Most emesis is post-tussive by history. Plan for Zofran, Albuterol, and PO challenge here in the ED.   CXR reviewed with no infiltrate. Patient feeling better after albuterol and Zofran. She is tolerating PO. Asking for additional work note which seems appropriate. Will have the patient f/u with PCP and return to the ED with any new or worsening symptoms. Discussed that the cougn may go on for several more weeks.   At this time, I do not feel there is any life-threatening condition present. I have reviewed and discussed all results (EKG, imaging, lab, urine as appropriate), exam findings with patient. I have reviewed nursing notes and appropriate previous records.  I feel the patient is safe to be discharged home without further emergent workup. Discussed usual and customary return precautions. Patient and family (if present) verbalize understanding and are comfortable with this plan.  Patient will follow-up with their primary care provider. If they do not have a primary care provider, information for follow-up has been provided to them. All questions have been  answered.  ____________________________________________  FINAL CLINICAL IMPRESSION(S) / ED DIAGNOSES  Final diagnoses:  Viral URI with cough  Non-intractable vomiting with nausea, unspecified vomiting type     MEDICATIONS GIVEN DURING THIS VISIT:  Medications  ondansetron (ZOFRAN-ODT) disintegrating tablet 4 mg (4 mg Oral Given 09/10/17 1516)  albuterol (PROVENTIL HFA;VENTOLIN HFA) 108 (90 Base) MCG/ACT inhaler 2 puff (2 puffs Inhalation Given 09/10/17 1521)     NEW OUTPATIENT MEDICATIONS STARTED DURING THIS VISIT:  Discharge Medication List as of 09/10/2017  4:26 PM    START taking these medications   Details  albuterol (PROVENTIL HFA;VENTOLIN HFA) 108 (90 Base) MCG/ACT inhaler Inhale 1-2 puffs into the lungs every 6 (six) hours as needed for wheezing or shortness of breath., Starting Mon 09/10/2017, Print    ondansetron (ZOFRAN ODT) 4 MG disintegrating tablet Take 1 tablet (4 mg total) by mouth every 8 (eight) hours as needed for nausea or vomiting., Starting Mon 09/10/2017, Print        Note:  This document was prepared using Dragon voice recognition software and may include unintentional dictation errors.  Alona Bene, MD Emergency Medicine    Long, Arlyss Repress, MD 09/11/17 [phone]

## 2018-01-23 ENCOUNTER — Ambulatory Visit: Payer: Self-pay | Admitting: Obstetrics & Gynecology

## 2018-01-23 DIAGNOSIS — Z09 Encounter for follow-up examination after completed treatment for conditions other than malignant neoplasm: Secondary | ICD-10-CM

## 2018-01-23 NOTE — Progress Notes (Deleted)
Patient would like to discuss being prescribed Metformin instead of OCP.

## 2018-05-02 ENCOUNTER — Ambulatory Visit (INDEPENDENT_AMBULATORY_CARE_PROVIDER_SITE_OTHER): Payer: 59 | Admitting: Family Medicine

## 2018-05-02 ENCOUNTER — Encounter: Payer: Self-pay | Admitting: Family Medicine

## 2018-05-02 VITALS — BP 114/70 | HR 78 | Ht 65.0 in | Wt 280.0 lb

## 2018-05-02 DIAGNOSIS — N760 Acute vaginitis: Secondary | ICD-10-CM

## 2018-05-02 DIAGNOSIS — B9689 Other specified bacterial agents as the cause of diseases classified elsewhere: Secondary | ICD-10-CM

## 2018-05-02 DIAGNOSIS — N898 Other specified noninflammatory disorders of vagina: Secondary | ICD-10-CM | POA: Diagnosis not present

## 2018-05-02 DIAGNOSIS — Z113 Encounter for screening for infections with a predominantly sexual mode of transmission: Secondary | ICD-10-CM | POA: Diagnosis not present

## 2018-05-02 NOTE — Progress Notes (Signed)
   Subjective:    Patient ID: Maria Mccarthy, female    DOB: [birth year], 33 y.o.   MRN: [id number]  HPI Patient seen for vaginal discharge with odor. Started earlier this month. No new partners   Review of Systems     Objective:   Physical Exam Exam conducted with a chaperone present.  Constitutional:      Appearance: Normal appearance.  HENT:     Head: Normocephalic.  Abdominal:     Hernia: There is no hernia in the right inguinal area or left inguinal area.  Genitourinary:    Labia:        Right: No rash, tenderness or lesion.        Left: No rash, tenderness or lesion.      Vagina: No signs of injury and foreign body. Vaginal discharge (thin white) present. No erythema, tenderness, bleeding, lesions or prolapsed vaginal walls.     Cervix: No discharge, friability, lesion, erythema, cervical bleeding or eversion.  Lymphadenopathy:     Lower Body: No right inguinal adenopathy. No left inguinal adenopathy.  Neurological:     Mental Status: She is alert.        Assessment & Plan:  1. Vaginal discharge - Cervicovaginal ancillary only( [place])

## 2018-05-02 NOTE — Progress Notes (Signed)
Pt states that she has been having discharge.

## 2018-05-03 LAB — CERVICOVAGINAL ANCILLARY ONLY
Bacterial vaginitis: POSITIVE — AB
CANDIDA VAGINITIS: NEGATIVE
Chlamydia: NEGATIVE
Neisseria Gonorrhea: NEGATIVE
TRICH (WINDOWPATH): NEGATIVE

## 2018-05-06 ENCOUNTER — Other Ambulatory Visit: Payer: Self-pay | Admitting: Family Medicine

## 2018-05-06 MED ORDER — METRONIDAZOLE 500 MG PO TABS
500.0000 mg | ORAL_TABLET | Freq: Two times a day (BID) | ORAL | 0 refills | Status: DC
Start: 2018-05-06 — End: 2018-05-31

## 2018-05-23 ENCOUNTER — Telehealth: Payer: Self-pay

## 2018-05-23 MED ORDER — FLUCONAZOLE 150 MG PO TABS: 150.0000 mg | ORAL_TABLET | Freq: Once | ORAL | 1 refills | Status: AC

## 2018-05-23 NOTE — Telephone Encounter (Signed)
Patient called stating that she finished the Flagyl Dr. Adrian Blackwater prescribed her for bacterial vaginosis and then she got her period. Patient states since Tuesday she has noticed a white clumpy discharge with some itching Explained to patient this is most likely a yeast infection since she finished flagyl and then started her period as well. Patient states understanding.  Explained she will take one diflucan now and then repeat in 48 hours if she is still having symptoms. If she is symptomatic after taking both doses she will need to come in for swab. Patient states understanding. Armandina Stammer RN

## 2018-05-28 ENCOUNTER — Other Ambulatory Visit (INDEPENDENT_AMBULATORY_CARE_PROVIDER_SITE_OTHER): Payer: 59

## 2018-05-28 VITALS — Ht 65.0 in | Wt 273.0 lb

## 2018-05-28 DIAGNOSIS — Z113 Encounter for screening for infections with a predominantly sexual mode of transmission: Secondary | ICD-10-CM | POA: Diagnosis not present

## 2018-05-28 DIAGNOSIS — N760 Acute vaginitis: Secondary | ICD-10-CM | POA: Diagnosis not present

## 2018-05-28 DIAGNOSIS — N898 Other specified noninflammatory disorders of vagina: Secondary | ICD-10-CM | POA: Diagnosis not present

## 2018-05-28 DIAGNOSIS — B9689 Other specified bacterial agents as the cause of diseases classified elsewhere: Secondary | ICD-10-CM

## 2018-05-28 NOTE — Progress Notes (Signed)
Chart reviewed - agree with RN documentation.   

## 2018-05-28 NOTE — Progress Notes (Signed)
SUBJECTIVE:  33 y.o. female complains of copious vaginal discharge for 1 week(s). Denies abnormal vaginal bleeding or significant pelvic pain or fever. No UTI symptoms. Denies history of known exposure to STD. Patient does states she has one new sexual partner  No LMP recorded.  OBJECTIVE:  She appears well, afebrile.   ASSESSMENT:  Vaginal Discharge     PLAN:  GC, chlamydia, trichomonas, BVAG, CVAG probe sent to lab. Treatment: To be determined once lab results are received ROV prn if symptoms persist or worsen.

## 2018-05-30 LAB — CERVICOVAGINAL ANCILLARY ONLY
BACTERIAL VAGINITIS: POSITIVE — AB
CANDIDA VAGINITIS: NEGATIVE
CHLAMYDIA, DNA PROBE: NEGATIVE
NEISSERIA GONORRHEA: NEGATIVE
TRICH (WINDOWPATH): NEGATIVE

## 2018-05-31 ENCOUNTER — Telehealth: Payer: Self-pay

## 2018-05-31 ENCOUNTER — Other Ambulatory Visit: Payer: Self-pay | Admitting: Family Medicine

## 2018-05-31 MED ORDER — METRONIDAZOLE 500 MG PO TABS: 500.0000 mg | ORAL_TABLET | Freq: Two times a day (BID) | ORAL | 0 refills | Status: DC

## 2018-05-31 NOTE — Telephone Encounter (Signed)
Patient called wondering why she has bacterial vaginosis again. Explained that she should take all the flagyl and avoid alcohol while taking the medication. Patient states understanding - patient asked if it could be because of her colon cleanse she is doing. Advised patient I dont know the ingredients in her colon cleanse and she was unable to provide them to me. Patient states she will stop doing that while taking the flagyl for the BV Armandina Stammer RN

## 2018-08-26 ENCOUNTER — Ambulatory Visit (INDEPENDENT_AMBULATORY_CARE_PROVIDER_SITE_OTHER): Payer: Managed Care, Other (non HMO)

## 2018-08-26 ENCOUNTER — Other Ambulatory Visit: Payer: Self-pay

## 2018-08-26 VITALS — BP 120/83 | HR 75 | Ht 65.0 in | Wt 256.0 lb

## 2018-08-26 DIAGNOSIS — N760 Acute vaginitis: Secondary | ICD-10-CM | POA: Diagnosis not present

## 2018-08-26 DIAGNOSIS — B9689 Other specified bacterial agents as the cause of diseases classified elsewhere: Secondary | ICD-10-CM

## 2018-08-26 DIAGNOSIS — N898 Other specified noninflammatory disorders of vagina: Secondary | ICD-10-CM | POA: Diagnosis not present

## 2018-08-26 NOTE — Progress Notes (Addendum)
Pt presents to the office stating that she has been having white discharge x 2 weeks. Wet prep was sent to the lab.  Kitt Minardi l Hasna Stefanik, CMA   Attestation of Attending Supervision of RN: Evaluation and management procedures were performed by the nurse under my supervision and collaboration.  I have reviewed the nursing note and chart, and I agree with the management and plan.  Carolyn L. Harraway-Smith, M.D., Evern Core

## 2018-08-27 LAB — CERVICOVAGINAL ANCILLARY ONLY
Bacterial vaginitis: POSITIVE — AB
Candida vaginitis: NEGATIVE

## 2018-08-28 MED ORDER — METRONIDAZOLE 500 MG PO TABS: 500.0000 mg | ORAL_TABLET | Freq: Two times a day (BID) | ORAL | 0 refills | Status: DC

## 2018-08-28 NOTE — Addendum Note (Signed)
Addended by: Willodean Rosenthal on: 08/28/2018 10:18 AM   Modules accepted: Orders

## 2018-09-03 ENCOUNTER — Telehealth: Payer: Self-pay

## 2018-09-03 NOTE — Telephone Encounter (Signed)
Called pt to inform her of positive BV results. Flagyl was sent to the pharmacy. Understanding was voiced.  Marshawn Ninneman l Cashtyn Pouliot, CMA

## 2018-10-24 ENCOUNTER — Other Ambulatory Visit: Payer: Self-pay

## 2018-10-24 ENCOUNTER — Encounter: Payer: Self-pay | Admitting: Obstetrics & Gynecology

## 2018-10-24 ENCOUNTER — Ambulatory Visit (INDEPENDENT_AMBULATORY_CARE_PROVIDER_SITE_OTHER): Payer: Managed Care, Other (non HMO) | Admitting: Obstetrics & Gynecology

## 2018-10-24 VITALS — BP 111/71 | HR 73 | Ht 65.0 in | Wt 251.0 lb

## 2018-10-24 DIAGNOSIS — B9689 Other specified bacterial agents as the cause of diseases classified elsewhere: Secondary | ICD-10-CM

## 2018-10-24 DIAGNOSIS — N760 Acute vaginitis: Secondary | ICD-10-CM | POA: Diagnosis not present

## 2018-10-24 DIAGNOSIS — N898 Other specified noninflammatory disorders of vagina: Secondary | ICD-10-CM | POA: Diagnosis not present

## 2018-10-24 MED ORDER — METRONIDAZOLE 500 MG PO TABS: 500.0000 mg | ORAL_TABLET | Freq: Two times a day (BID) | ORAL | 0 refills | Status: DC

## 2018-10-27 ENCOUNTER — Encounter: Payer: Self-pay | Admitting: Obstetrics & Gynecology

## 2018-10-27 NOTE — Progress Notes (Signed)
History:  33 y.o. G0P0000 here today for f/u of recurrent episodes of BV. In discussion with pt, she has started to use unscented soap ,FOLLOWED by scented soap. She reports using bubble bath and she reports using bleach in her bath water. She also reports that she continues to use a vaginal douche.   The following portions of the patient's history were reviewed and updated as appropriate: allergies, current medications, past family history, past medical history, past social history, past surgical history and problem list.  Review of Systems:  Pertinent items are noted in HPI.    Objective:  Physical Exam Blood pressure 111/71, pulse 73, height 5\' 5"  (1.651 m), weight 251 lb (113.9 kg), last menstrual period 10/07/2018.  CONSTITUTIONAL: Well-developed, well-nourished female in no acute distress.  HENT:  Normocephalic, atraumatic EYES: Conjunctivae and EOM are normal. No scleral icterus.  NECK: Normal range of motion SKIN: Skin is warm and dry. No rash noted. Not diaphoretic.No pallor. Bromide: Alert and oriented to person, place, and time. Normal coordination.  Pelvic exam deferred. Self swab collected   Assessment & Plan:  Recurrent BV- I have reviewed with pt in detail behaviors that are likely contributing to her recurrent BV. She was unclear that with adding the new behaviors she was to COMPLETELY stop the prev behaviors.   Review GO WHITE plan Reviewed diet and exercise.  Reviewed the tx options for recurrent BV but, as pt has not made any of the changes she would like to adjust her behavior before attempting the prolonged tx option.  Rec f/u in 3 months or sooner prn Flagyl 500mg  bid x 10 days  Total face-to-face time with patient was 20 min.  Greater than 50% was spent in counseling and coordination of care with the patient.   Syrita Dovel L. Harraway-Smith, M.D., Cherlynn June

## 2018-10-28 LAB — CERVICOVAGINAL ANCILLARY ONLY
Bacterial vaginitis: POSITIVE — AB
Candida vaginitis: NEGATIVE

## 2018-10-29 ENCOUNTER — Telehealth: Payer: Self-pay

## 2018-10-29 NOTE — Telephone Encounter (Signed)
Called pt to inform her of positive BV results. Pt's voicemail was not set up. Flagyl was sent to pharmacy.  chiquita l wilson, CMA

## 2018-10-30 ENCOUNTER — Other Ambulatory Visit: Payer: Self-pay | Admitting: Obstetrics & Gynecology

## 2018-11-13 ENCOUNTER — Other Ambulatory Visit: Payer: Self-pay

## 2018-11-13 MED ORDER — FLUCONAZOLE 150 MG PO TABS: 150.0000 mg | ORAL_TABLET | Freq: Once | ORAL | 1 refills | Status: AC

## 2018-12-02 ENCOUNTER — Ambulatory Visit: Payer: Managed Care, Other (non HMO) | Admitting: Obstetrics & Gynecology

## 2018-12-02 DIAGNOSIS — Z09 Encounter for follow-up examination after completed treatment for conditions other than malignant neoplasm: Secondary | ICD-10-CM

## 2019-03-13 ENCOUNTER — Ambulatory Visit: Payer: Managed Care, Other (non HMO) | Admitting: Family Medicine

## 2019-09-18 ENCOUNTER — Ambulatory Visit: Payer: Managed Care, Other (non HMO)

## 2019-09-29 ENCOUNTER — Other Ambulatory Visit: Payer: Managed Care, Other (non HMO)

## 2019-10-29 ENCOUNTER — Ambulatory Visit: Payer: Managed Care, Other (non HMO) | Admitting: Obstetrics & Gynecology

## 2019-11-05 ENCOUNTER — Ambulatory Visit: Payer: Managed Care, Other (non HMO) | Admitting: Obstetrics & Gynecology

## 2019-12-09 ENCOUNTER — Encounter (HOSPITAL_BASED_OUTPATIENT_CLINIC_OR_DEPARTMENT_OTHER): Payer: Self-pay | Admitting: *Deleted

## 2019-12-09 ENCOUNTER — Other Ambulatory Visit: Payer: Self-pay

## 2019-12-09 ENCOUNTER — Emergency Department (HOSPITAL_BASED_OUTPATIENT_CLINIC_OR_DEPARTMENT_OTHER)
Admission: EM | Admit: 2019-12-09 | Discharge: 2019-12-09 | Disposition: A | Discharge status: Home / Self Care | Payer: Managed Care, Other (non HMO) | Attending: Emergency Medicine | Admitting: Emergency Medicine

## 2019-12-09 DIAGNOSIS — Z5321 Procedure and treatment not carried out due to patient leaving prior to being seen by health care provider: Secondary | ICD-10-CM | POA: Diagnosis not present

## 2019-12-09 DIAGNOSIS — M25569 Pain in unspecified knee: Secondary | ICD-10-CM | POA: Diagnosis not present

## 2019-12-09 NOTE — ED Triage Notes (Signed)
Pt reports pressing the clutch in her truck this morning and has had knee pain since.

## 2019-12-10 ENCOUNTER — Other Ambulatory Visit: Payer: Self-pay

## 2019-12-10 ENCOUNTER — Emergency Department (HOSPITAL_BASED_OUTPATIENT_CLINIC_OR_DEPARTMENT_OTHER): Payer: Managed Care, Other (non HMO)

## 2019-12-10 ENCOUNTER — Emergency Department (HOSPITAL_BASED_OUTPATIENT_CLINIC_OR_DEPARTMENT_OTHER)
Admission: EM | Admit: 2019-12-10 | Discharge: 2019-12-10 | Disposition: A | Discharge status: Home / Self Care | Payer: Managed Care, Other (non HMO) | Attending: Emergency Medicine | Admitting: Emergency Medicine

## 2019-12-10 ENCOUNTER — Encounter (HOSPITAL_BASED_OUTPATIENT_CLINIC_OR_DEPARTMENT_OTHER): Payer: Self-pay | Admitting: *Deleted

## 2019-12-10 DIAGNOSIS — F17290 Nicotine dependence, other tobacco product, uncomplicated: Secondary | ICD-10-CM | POA: Insufficient documentation

## 2019-12-10 DIAGNOSIS — M79605 Pain in left leg: Secondary | ICD-10-CM | POA: Diagnosis not present

## 2019-12-10 DIAGNOSIS — M25562 Pain in left knee: Secondary | ICD-10-CM | POA: Diagnosis not present

## 2019-12-10 MED ORDER — KETOROLAC TROMETHAMINE 60 MG/2ML IM SOLN
15.0000 mg | Freq: Once | INTRAMUSCULAR | Status: AC
  Administered 2019-12-10: 15 mg via INTRAMUSCULAR
  Filled 2019-12-10: qty 2

## 2019-12-10 NOTE — ED Triage Notes (Signed)
Was here yesterday but was not seen.  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

## 2019-12-10 NOTE — ED Notes (Signed)
ED Provider at bedside. 

## 2019-12-10 NOTE — Discharge Instructions (Signed)
Please use your knee brace and crutches when ambulating.  I recommend you rest, elevate, use ice and Tylenol for pain as needed.  Follow-up with the sports medicine doctor for further evaluation.

## 2019-12-10 NOTE — ED Provider Notes (Signed)
[facility] EMERGENCY DEPARTMENT Provider Note   CSN: [id number] Arrival date & time: 12/10/19  0754     History Chief Complaint  Patient presents with  . Knee Pain    Maria Mccarthy is a 34 y.o. female.  34yo female presents with L leg pain x2 days.  Pain began as sharp shooting pain from left knee up to hip when pressing in the clutch of her truck.  Since that time the pain has increased and has difficulty with weightbearing and bending of her leg.  Has some sensation that her knee is going to give out when she stands on it.  She is still able to ambulate if she keeps her leg straight and favors the right side.  Denies any redness, swelling of the knee.  Has tried Tylenol and topicals without any pain relief.  Denies any known history of knee injury.    History reviewed. No pertinent past medical history.  Patient Active Problem List   Diagnosis Date Noted  . DUB (dysfunctional uterine bleeding) 09/11/2013    Past Surgical History:  Procedure Laterality Date  . NO PAST SURGERIES      OB History    Gravida  3   Para      Term      Preterm      AB  3   Living  0     SAB  0   TAB      Ectopic      Multiple      Live Births             Family History  Problem Relation Age of Onset  . Mental illness Mother   . Diabetes Maternal Grandmother   . Hypertension Maternal Grandmother   . Diabetes Maternal Grandfather   . Diabetes Paternal Grandfather    Social History   Tobacco Use  . Smoking status: Current Every Day Smoker    Packs/day: 0.25    Types: Cigars  . Smokeless tobacco: Never Used  Substance Use Topics  . Alcohol use: Yes    Comment: Occ  . Drug use: No   Home Medications Prior to Admission medications   Not on File   Allergies    Patient has no known allergies.  Review of Systems   Review of Systems  Physical Exam Updated Vital Signs BP 103/69   Pulse 83   Temp 98.1 F (36.7 C) (Oral)   Resp 16   Ht 5\' 2"   (1.575 m)   Wt 108 kg   LMP 12/07/2019   SpO2 100%   BMI 43.55 kg/m   Physical Exam Vitals and nursing note reviewed.  Constitutional:      Appearance: Normal appearance.  Pulmonary:     Effort: Pulmonary effort is normal.  Musculoskeletal:     Right knee: Normal.     Left knee: Swelling and effusion present. No deformity, erythema, ecchymosis, lacerations or crepitus. Normal range of motion. Tenderness present over the lateral joint line. No LCL laxity, MCL laxity, ACL laxity or PCL laxity.    Instability Tests: Anterior drawer test negative. Posterior drawer test negative. Lateral McMurray test positive. Medial McMurray test negative.     Comments: Unable to complete [place]ss[place] due to pain and instability. Strength testing of plantar flexion, dorsiflexion, flexion and extension at the knee was unable to assess due to pain.  Mild effusion was appreciated in anterior joint space.  Neurological:     Mental Status: She is alert.  ED Results / Procedures / Treatments   Labs (all labs ordered are listed, but only abnormal results are displayed) Labs Reviewed - No data to display  EKG None  Radiology No results found.  Procedures Procedures (including critical care time)  Medications Ordered in ED Medications  ketorolac (TORADOL) injection 15 mg (has no administration in time range)    ED Course  I have reviewed the triage vital signs and the nursing notes.  Pertinent labs & imaging results that were available during my care of the patient were reviewed by me and considered in my medical decision making (see chart for details).    MDM Rules/Calculators/A&P                          Nontraumatic acute pain in left lateral knee preventing weightbearing and flexion of the knee. X-ray to rule out fracture neg. Showing some subchondral sclerosis and bone spurring. Likely intraarticular and meniscal based on hx/px. Patient fitted with stabilizing brace and crutches. Instructed  to limit weight bearing until follow up. Referral for sports medicine placed with contact information in AVS.  Final Clinical Impression(s) / ED Diagnoses Final diagnoses:  None    Rx / DC Orders ED Discharge Orders    None       Leeroy Bock, DO 12/10/19 1000    Melene Plan, DO 12/10/19 1056    Melene Plan, DO 12/10/19 1057

## 2019-12-11 ENCOUNTER — Ambulatory Visit: Payer: Self-pay

## 2019-12-11 ENCOUNTER — Encounter: Payer: Self-pay | Admitting: Family Medicine

## 2019-12-11 ENCOUNTER — Ambulatory Visit (INDEPENDENT_AMBULATORY_CARE_PROVIDER_SITE_OTHER): Payer: Managed Care, Other (non HMO) | Admitting: Family Medicine

## 2019-12-11 VITALS — BP 117/84 | HR 69 | Ht 63.0 in | Wt 232.0 lb

## 2019-12-11 DIAGNOSIS — M76899 Other specified enthesopathies of unspecified lower limb, excluding foot: Secondary | ICD-10-CM | POA: Diagnosis not present

## 2019-12-11 DIAGNOSIS — M25562 Pain in left knee: Secondary | ICD-10-CM

## 2019-12-11 MED ORDER — PREDNISONE 5 MG PO TABS: ORAL_TABLET | ORAL | 0 refills | Status: DC

## 2019-12-11 NOTE — Progress Notes (Signed)
Maria Mccarthy - 34 y.o. female MRN [id number]  Date of birth: February 12, 1986  SUBJECTIVE:  Including CC & ROS.  Chief Complaint  Patient presents with  . Knee Pain    left x 3 days    Maria Mccarthy is a 34 y.o. female that is presenting with left knee pain.  She denies any specific inciting event.  She is having pain at the anterior that radiates more proximal.  Localized to the knee.  No history of surgery.  Has not tried any medications.  Significant pain with bearing weight or extending the knee..  Independent review of the left knee x-ray from 9/1 shows no acute changes.   Review of Systems See HPI   HISTORY: Past Medical, Surgical, Social, and Family History Reviewed & Updated per EMR.   Pertinent Historical Findings include:  No past medical history on file.  Past Surgical History:  Procedure Laterality Date  . NO PAST SURGERIES      Family History  Problem Relation Age of Onset  . Mental illness Mother   . Diabetes Maternal Grandmother   . Hypertension Maternal Grandmother   . Diabetes Maternal Grandfather   . Diabetes Paternal Grandfather     Social History   Socioeconomic History  . Marital status: Single    Spouse name: Not on file  . Number of children: Not on file  . Years of education: Not on file  . Highest education level: Not on file  Occupational History  . Not on file  Tobacco Use  . Smoking status: Current Every Day Smoker    Packs/day: 0.25    Types: Cigars  . Smokeless tobacco: Never Used  Substance and Sexual Activity  . Alcohol use: Yes    Comment: Occ  . Drug use: No  . Sexual activity: Yes    Birth control/protection: None  Other Topics Concern  . Not on file  Social History Narrative  . Not on file   Social Determinants of Health   Financial Resource Strain:   . Difficulty of Paying Living Expenses: Not on file  Food Insecurity:   . Worried About Programme researcher, broadcasting/film/video in the Last Year: Not on file  . Ran Out of Food in the  Last Year: Not on file  Transportation Needs:   . Lack of Transportation (Medical): Not on file  . Lack of Transportation (Non-Medical): Not on file  Physical Activity:   . Days of Exercise per Week: Not on file  . Minutes of Exercise per Session: Not on file  Stress:   . Feeling of Stress : Not on file  Social Connections:   . Frequency of Communication with Friends and Family: Not on file  . Frequency of Social Gatherings with Friends and Family: Not on file  . Attends Religious Services: Not on file  . Active Member of Clubs or Organizations: Not on file  . Attends Banker Meetings: Not on file  . Marital Status: Not on file  Intimate Partner Violence:   . Fear of Current or Ex-Partner: Not on file  . Emotionally Abused: Not on file  . Physically Abused: Not on file  . Sexually Abused: Not on file     PHYSICAL EXAM:  VS: BP 117/84   Pulse 69   Ht 5\' 3"  (1.6 m)   Wt 232 lb (105.2 kg)   LMP 12/07/2019   BMI 41.10 kg/m  Physical Exam Gen: NAD, alert, cooperative with exam, well-appearing MSK:  Left knee:  No obvious effusion. Tenderness to palpation of the quadricep. Pain with extension. No joint line tenderness. Neurovascularly intact  Limited ultrasound: Left knee:  No effusion suprapatellar pouch. Increased hyperemia at the insertion of the quadriceps tendon. No changes of the patellar tendon. Normal-appearing medial meniscus and joint space. Normal-appearing lateral meniscus and joint space.   Summary: Findings suggestive of quadriceps tendinitis .  Ultrasound and interpretation by Clare Gandy, MD    ASSESSMENT & PLAN:   Quadriceps tendinitis Appears to be quadriceps tendinitis.  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  No family history or personal history of any rheumatologic or inflammatory condition. -Counseled on home exercise therapy and supportive care. -Prednisone. -Counseled on brace. -Could consider  physical therapy

## 2019-12-11 NOTE — Patient Instructions (Signed)
Nice to meet you Please try ice  Please try the medicine  Please continue the brace  Please try the exercises once the pain has improved  Please send me a message in MyChart with any questions or updates.  Please see me back in 4 weeks.   --Dr. Jordan Likes

## 2019-12-11 NOTE — Assessment & Plan Note (Signed)
Appears to be quadriceps tendinitis.  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  No family history or personal history of any rheumatologic or inflammatory condition. -Counseled on home exercise therapy and supportive care. -Prednisone. -Counseled on brace. -Could consider physical therapy

## 2019-12-12 ENCOUNTER — Ambulatory Visit: Payer: Managed Care, Other (non HMO) | Admitting: Obstetrics & Gynecology

## 2020-01-12 ENCOUNTER — Other Ambulatory Visit: Payer: Self-pay

## 2020-01-12 ENCOUNTER — Encounter: Payer: Self-pay | Admitting: Family Medicine

## 2020-01-12 ENCOUNTER — Ambulatory Visit (INDEPENDENT_AMBULATORY_CARE_PROVIDER_SITE_OTHER): Payer: Managed Care, Other (non HMO) | Admitting: Family Medicine

## 2020-01-12 DIAGNOSIS — M76899 Other specified enthesopathies of unspecified lower limb, excluding foot: Secondary | ICD-10-CM

## 2020-01-12 MED ORDER — NAPROXEN 500 MG PO TABS: 500.0000 mg | ORAL_TABLET | Freq: Two times a day (BID) | ORAL | 0 refills | Status: DC | PRN

## 2020-01-12 NOTE — Assessment & Plan Note (Addendum)
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  -Counseled on home exercise therapy and supportive care. -Naproxen. -Referral to physical therapy. -Provided work note. -Could consider further imaging if needed.

## 2020-01-12 NOTE — Patient Instructions (Signed)
Good to see you Please take the naproxen. Do not take with aspirin or ibuprofen  Please use ice  Physical therapy will give you a call    Please send me a message in MyChart with any questions or updates.  Please see me back in 4 weeks.   --Dr. Jordan Likes

## 2020-01-12 NOTE — Progress Notes (Signed)
Maria Mccarthy - 34 y.o. female MRN [id number]  Date of birth: Jun 03, 1985  SUBJECTIVE:  Including CC & ROS.  Chief Complaint  Patient presents with  . Follow-up    left knee    Maria Mccarthy is a 34 y.o. female that is presenting with ongoing left knee pain.  She has gotten some improvement but still notices the pain when she is stepping up into the truck.  Feels like the knee may give him from time to time.   Review of Systems See HPI   HISTORY: Past Medical, Surgical, Social, and Family History Reviewed & Updated per EMR.   Pertinent Historical Findings include:  No past medical history on file.  Past Surgical History:  Procedure Laterality Date  . NO PAST SURGERIES      Family History  Problem Relation Age of Onset  . Mental illness Mother   . Diabetes Maternal Grandmother   . Hypertension Maternal Grandmother   . Diabetes Maternal Grandfather   . Diabetes Paternal Grandfather     Social History   Socioeconomic History  . Marital status: Single    Spouse name: Not on file  . Number of children: Not on file  . Years of education: Not on file  . Highest education level: Not on file  Occupational History  . Not on file  Tobacco Use  . Smoking status: Current Every Day Smoker    Packs/day: 0.25    Types: Cigars  . Smokeless tobacco: Never Used  Substance and Sexual Activity  . Alcohol use: Yes    Comment: Occ  . Drug use: No  . Sexual activity: Yes    Birth control/protection: None  Other Topics Concern  . Not on file  Social History Narrative  . Not on file   Social Determinants of Health   Financial Resource Strain:   . Difficulty of Paying Living Expenses: Not on file  Food Insecurity:   . Worried About Programme researcher, broadcasting/film/video in the Last Year: Not on file  . Ran Out of Food in the Last Year: Not on file  Transportation Needs:   . Lack of Transportation (Medical): Not on file  . Lack of Transportation (Non-Medical): Not on file  Physical  Activity:   . Days of Exercise per Week: Not on file  . Minutes of Exercise per Session: Not on file  Stress:   . Feeling of Stress : Not on file  Social Connections:   . Frequency of Communication with Friends and Family: Not on file  . Frequency of Social Gatherings with Friends and Family: Not on file  . Attends Religious Services: Not on file  . Active Member of Clubs or Organizations: Not on file  . Attends Banker Meetings: Not on file  . Marital Status: Not on file  Intimate Partner Violence:   . Fear of Current or Ex-Partner: Not on file  . Emotionally Abused: Not on file  . Physically Abused: Not on file  . Sexually Abused: Not on file     PHYSICAL EXAM:  VS: BP 119/86   Pulse 71   Ht 5\' 2"  (1.575 m)   BMI 42.43 kg/m  Physical Exam Gen: NAD, alert, cooperative with exam, well-appearing MSK:  Left knee: No effusion. Normal range of motion. Tenderness palpation of the quadriceps tendon. Neurovascularly intact     ASSESSMENT & PLAN:   Quadriceps tendinitis Improving but still noticing some pain with getting up into her truck.  -Counseled on home  exercise therapy and supportive care. -Naproxen. -Referral to physical therapy. -Provided work note. -Could consider further imaging if needed.

## 2020-01-14 ENCOUNTER — Encounter: Payer: Self-pay | Admitting: Physical Therapy

## 2020-01-14 ENCOUNTER — Other Ambulatory Visit: Payer: Self-pay

## 2020-01-14 ENCOUNTER — Ambulatory Visit: Payer: Managed Care, Other (non HMO) | Attending: Family Medicine | Admitting: Physical Therapy

## 2020-01-14 DIAGNOSIS — M25662 Stiffness of left knee, not elsewhere classified: Secondary | ICD-10-CM | POA: Diagnosis present

## 2020-01-14 DIAGNOSIS — M25562 Pain in left knee: Secondary | ICD-10-CM | POA: Diagnosis not present

## 2020-01-14 DIAGNOSIS — R262 Difficulty in walking, not elsewhere classified: Secondary | ICD-10-CM | POA: Insufficient documentation

## 2020-01-14 NOTE — Therapy (Signed)
[facility] [address] [place], [place], [address] Phone: [phone]   Fax:  [phone]  Physical Therapy Evaluation  Patient Details  Name: Maria Mccarthy MRN: [id number] Date of Birth: 1986/02/19 Referring Provider (PT): Lenn Cal Date: 01/14/2020   PT End of Session - 01/14/20 1646    Visit Number 1    Date for PT Re-Evaluation 03/15/20    PT Start Time 1619    PT Stop Time 1655    PT Time Calculation (min) 36 min    Activity Tolerance Patient tolerated treatment well    Behavior During Therapy [facility] for tasks assessed/performed           History reviewed. No pertinent past medical history.  Past Surgical History:  Procedure Laterality Date  . NO PAST SURGERIES      There were no vitals filed for this visit.    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  She was put on prednisone and was off work for 6 days, reports that she was doing better but then returned to work and the pain returned    Limitations Standing;Walking;House hold activities    Patient Stated Goals have less pain , no diffiuclty driving    Currently in Pain? Yes    Pain Score 6     Pain Location Knee    Pain Orientation Left;Lateral;Proximal    Pain Descriptors / Indicators Discomfort;Sharp    Pain Type Acute pain    Pain Onset More than a month ago    Pain Frequency Constant    Aggravating Factors  stairs, climbing into the truck, using the clutch pain up to 9-10/10    Pain Relieving Factors pain meds, rest pain can be a 2/10    Effect of Pain on Daily Activities limits walking, driving, really hurts              [facility] PT Assessment - 01/14/20 0001      Assessment   Medical Diagnosis left quad tendonitis     Referring Provider (PT) Jordan Likes    Onset Date/Surgical Date 12/15/19    Prior Therapy no      Precautions   Precautions None      Balance Screen   Has the patient fallen in the past 6 months No    Has the patient had a decrease in activity level because of a fear of falling?  No    Is the patient reluctant to leave their home because of a fear of falling?  No      Home Environment   Additional Comments has stairs,       Prior Function   Level of Independence Independent    Vocation Full time employment    Vocation Requirements truck driver, in and out of truck, lifting up to 50#    Leisure some walking for exercise      ROM / Strength   AROM / PROM / Strength AROM;Strength      AROM   Overall AROM Comments pain iwth flexion    AROM Assessment Site Knee    Right/Left Knee Left    Left Knee Extension 15    Left Knee Flexion 102      Strength  Overall Strength Comments 3+/5 with pain      Palpation   Palpation comment very tender in the left proximal lateral patella      Ambulation/Gait   Gait Comments no device today but significant limp on the left, stiff legged, used crutches fo rthe first wee                      Objective measurements completed on examination: See above findings.       [facility] Adult PT Treatment/Exercise - 01/14/20 0001      Modalities   Modalities Iontophoresis      Iontophoresis   Type of Iontophoresis Dexamethasone    Location left superior lateral patella    Dose 35mA    Time 4 hour patch                  PT Education - 01/14/20 1645    Education Details did a lot of education about trying to decrease the stress on teh left knee, stairs one at a time, up with good, down with bad and same in and out of the truck    Person(s) Educated Patient    Methods Explanation;Demonstration    Comprehension Verbalized understanding            PT Short Term Goals - 01/14/20 1650      PT SHORT TERM GOAL #1   Title  independent with initial HEP    Time 2    Status New             PT Long Term Goals - 01/14/20 1650      PT LONG TERM GOAL #1   Title report pain decreaesd 50%    Time 8    Period Weeks    Status New      PT LONG TERM GOAL #2   Title go up and down stairs step over step    Time 8    Period Weeks    Status New      PT LONG TERM GOAL #3   Title increase left knee ROM to WNL's    Time 8    Period Weeks    Status New      PT LONG TERM GOAL #4   Title increase strength to 4+/5    Time 8    Period Weeks    Status New                  Plan - 01/14/20 1647    Clinical Impression Statement Pateint presents with superior lateral left patella pain, she is unsure of a cause but 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  She has limited ROM, she is very weak, she is very tender to the left superior/lateral patellar area and into the lateralus mm, her gait is very poor, antalgic on the left, stiff legged and slow.    Stability/Clinical Decision Making Evolving/Moderate complexity    Clinical Decision Making Low    Rehab Potential Good    PT Frequency 2x / week    PT Duration 8 weeks    PT Treatment/Interventions ADLs/Self Care Home Management;Cryotherapy;Electrical Stimulation;Iontophoresis 4mg /ml Dexamethasone;Moist Heat;Ultrasound;Gait training;Stair training;Functional mobility training;Therapeutic activities;Therapeutic exercise;Balance training;Neuromuscular re-education;Manual techniques;Patient/family education;Vasopneumatic Device    PT Next Visit Plan slowly start some movements but be careful with the pain  Consulted and Agree with Plan of Care Patient           Patient will benefit from skilled therapeutic intervention in order to improve the following deficits and impairments:  Abnormal gait, Decreased activity tolerance, Decreased balance, Decreased  mobility, Decreased strength, Decreased range of motion, Difficulty walking, Pain  Visit Diagnosis: Acute pain of left knee - Plan: PT plan of care cert/re-cert  Stiffness of left knee, not elsewhere classified - Plan: PT plan of care cert/re-cert  Difficulty in walking, not elsewhere classified - Plan: PT plan of care cert/re-cert     Problem List Patient Active Problem List   Diagnosis Date Noted  . Quadriceps tendinitis 12/11/2019  . DUB (dysfunctional uterine bleeding) 09/11/2013    Jearld Lesch., PT 01/14/2020, 4:56 PM  [facility] [address] [place], [place], [address] Phone: [phone]   Fax:  [phone]  Name: Maria Mccarthy MRN: [id number] Date of Birth: [birth year]

## 2020-01-20 ENCOUNTER — Ambulatory Visit: Payer: Managed Care, Other (non HMO) | Admitting: Physical Therapy

## 2020-01-21 ENCOUNTER — Encounter: Payer: Managed Care, Other (non HMO) | Admitting: Physical Therapy

## 2020-01-22 ENCOUNTER — Ambulatory Visit: Payer: Managed Care, Other (non HMO) | Admitting: Physical Therapy

## 2020-01-22 ENCOUNTER — Telehealth: Payer: Self-pay | Admitting: Family Medicine

## 2020-01-22 NOTE — Telephone Encounter (Signed)
Patient called states has 2 issues:  Doesn't know if she should proceed w/ PT due to now states wants WC to cvg treatment & has filed a claim w/ Associate Professor.  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  AdjusterJasmine December Henry(White) @ [phone]- fax# [phone]  --I called Ms.Henry-White @ [employer] and was advised Claim has not been accepted until medical info received & reviewed.   ---- assigned case # [id number] waiting on medical records & a signed Release of Information from patient (neither party has a ROI on file) WC carrier has mailed one out to patient & waiting on return)  FYI  --glh

## 2020-02-14 IMAGING — CR DG CHEST 2V
2 series · 2 of 2 positions shown · non-contrast
Comparison: None.

CLINICAL DATA: Hemoptysis.

EXAM:
CHEST - 2 VIEW

[w chest pa]
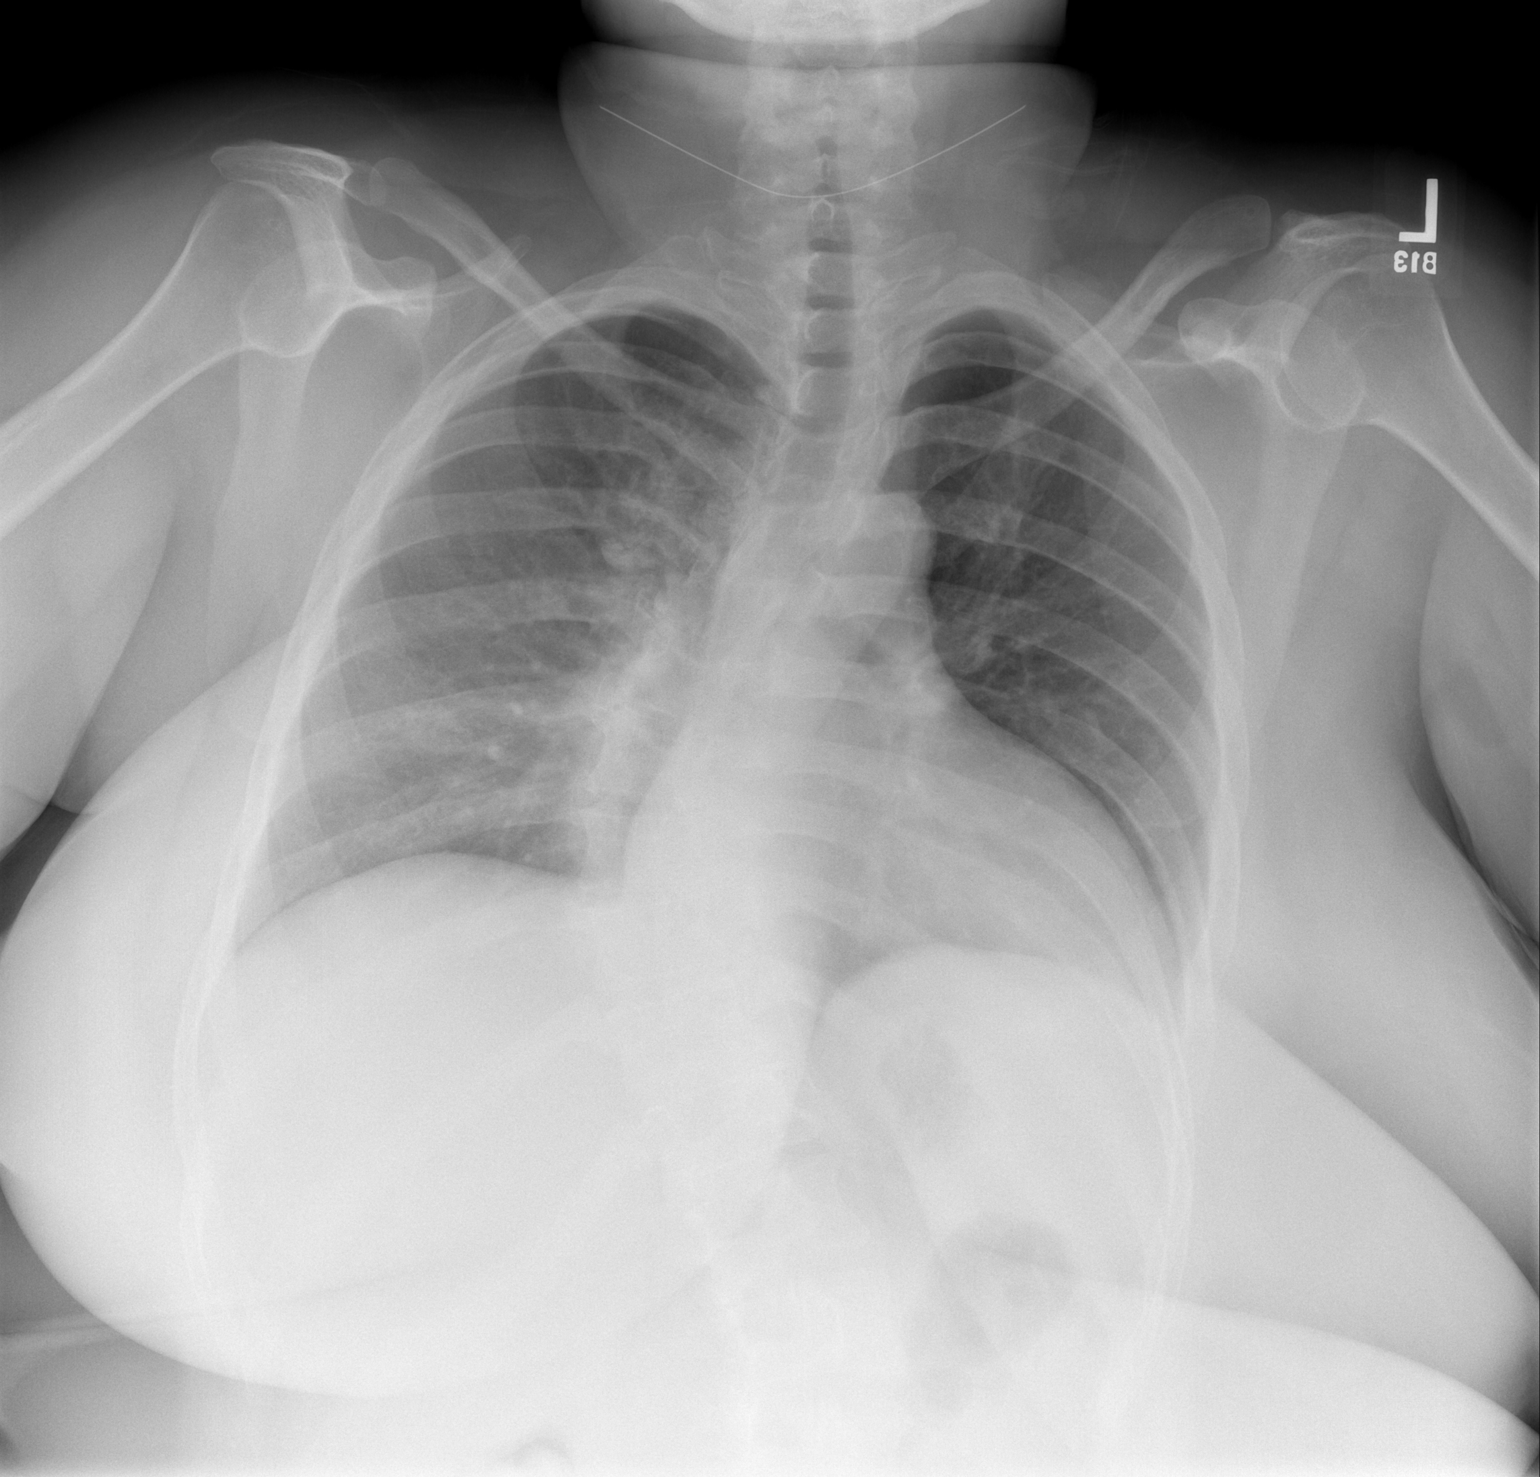

[w chest lat]
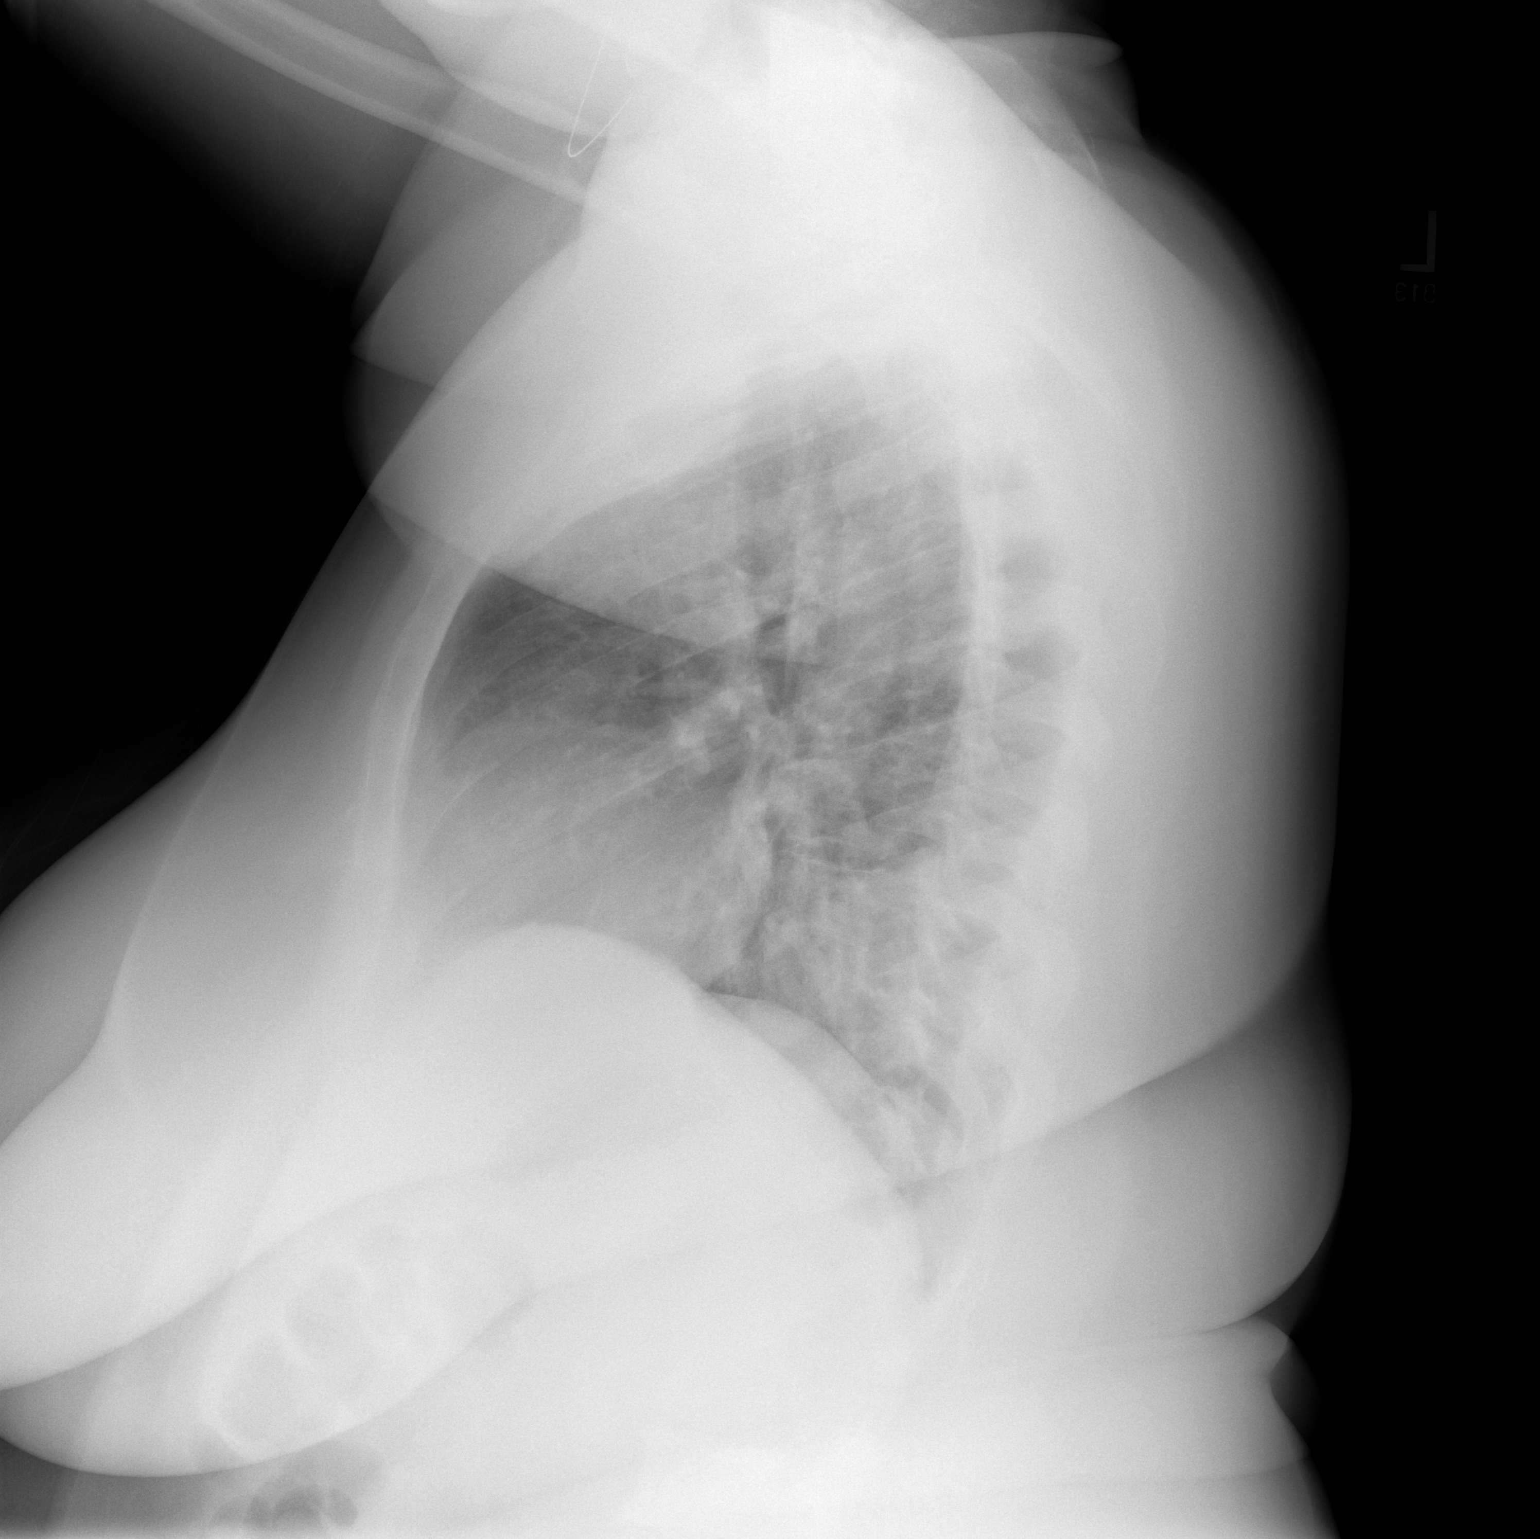

[2 of 2 positions shown; findings below may reference images not displayed]

FINDINGS: Significant scoliotic curvature to the thoracic spine, apex to the
right. No pneumothorax. The heart size is borderline. The right
hilum is obscured by the overlapping spine but grossly unremarkable.
The left hilum and mediastinum are normal. There is haziness over
the right chest, likely due to positioning. No focal infiltrate,
nodule, or mass.
IMPRESSION: Scoliosis. Haziness over the right chest is likely positional. No
definitive infiltrate or mass to explain the patient's symptoms.

## 2020-02-16 ENCOUNTER — Ambulatory Visit: Payer: Managed Care, Other (non HMO) | Admitting: Obstetrics & Gynecology

## 2020-02-16 ENCOUNTER — Ambulatory Visit: Payer: Managed Care, Other (non HMO) | Admitting: Family Medicine

## 2020-02-16 NOTE — Progress Notes (Deleted)
  Clemma Johnsen - 34 y.o. female MRN [id number]  Date of birth: 04-20-1985  SUBJECTIVE:  Including CC & ROS.  No chief complaint on file.   Clarity Ciszek is a 34 y.o. female that is  ***.  ***   Review of Systems See HPI   HISTORY: Past Medical, Surgical, Social, and Family History Reviewed & Updated per EMR.   Pertinent Historical Findings include:  No past medical history on file.  Past Surgical History:  Procedure Laterality Date  . NO PAST SURGERIES      Family History  Problem Relation Age of Onset  . Mental illness Mother   . Diabetes Maternal Grandmother   . Hypertension Maternal Grandmother   . Diabetes Maternal Grandfather   . Diabetes Paternal Grandfather     Social History   Socioeconomic History  . Marital status: Single    Spouse name: Not on file  . Number of children: Not on file  . Years of education: Not on file  . Highest education level: Not on file  Occupational History  . Not on file  Tobacco Use  . Smoking status: Current Every Day Smoker    Packs/day: 0.25    Types: Cigars  . Smokeless tobacco: Never Used  Substance and Sexual Activity  . Alcohol use: Yes    Comment: Occ  . Drug use: No  . Sexual activity: Yes    Birth control/protection: None  Other Topics Concern  . Not on file  Social History Narrative  . Not on file   Social Determinants of Health   Financial Resource Strain:   . Difficulty of Paying Living Expenses: Not on file  Food Insecurity:   . Worried About Programme researcher, broadcasting/film/video in the Last Year: Not on file  . Ran Out of Food in the Last Year: Not on file  Transportation Needs:   . Lack of Transportation (Medical): Not on file  . Lack of Transportation (Non-Medical): Not on file  Physical Activity:   . Days of Exercise per Week: Not on file  . Minutes of Exercise per Session: Not on file  Stress:   . Feeling of Stress : Not on file  Social Connections:   . Frequency of Communication with Friends and Family:  Not on file  . Frequency of Social Gatherings with Friends and Family: Not on file  . Attends Religious Services: Not on file  . Active Member of Clubs or Organizations: Not on file  . Attends Banker Meetings: Not on file  . Marital Status: Not on file  Intimate Partner Violence:   . Fear of Current or Ex-Partner: Not on file  . Emotionally Abused: Not on file  . Physically Abused: Not on file  . Sexually Abused: Not on file     PHYSICAL EXAM:  VS: There were no vitals taken for this visit. Physical Exam Gen: NAD, alert, cooperative with exam, well-appearing MSK:  ***      ASSESSMENT & PLAN:   No problem-specific Assessment & Plan notes found for this encounter.

## 2020-02-19 IMAGING — CR DG CHEST 2V
2 series · 2 of 2 positions shown · non-contrast
Comparison: 09/05/2017

CLINICAL DATA: Worsening cough

EXAM:
CHEST - 2 VIEW

[w chest pa *]
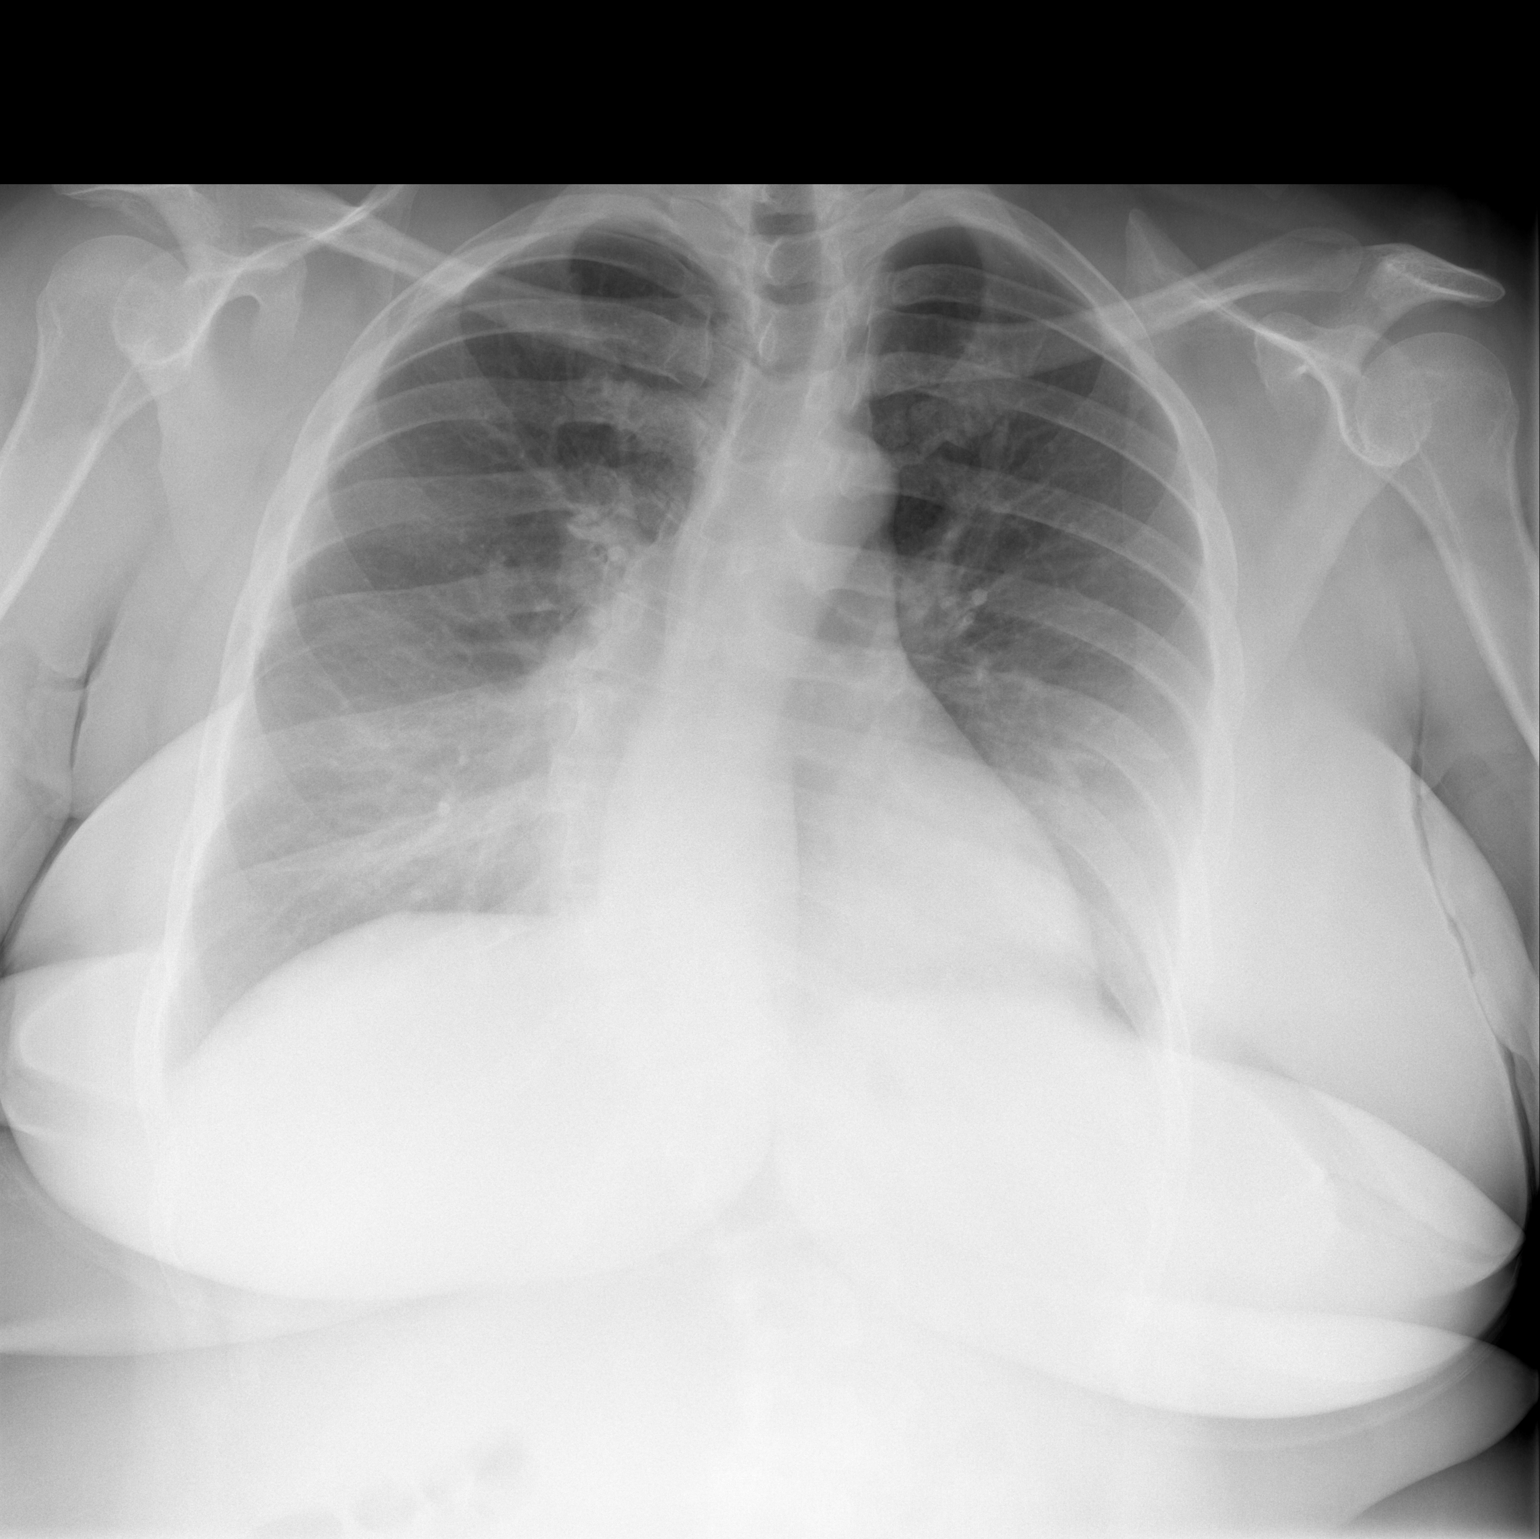

[w chest lat *]
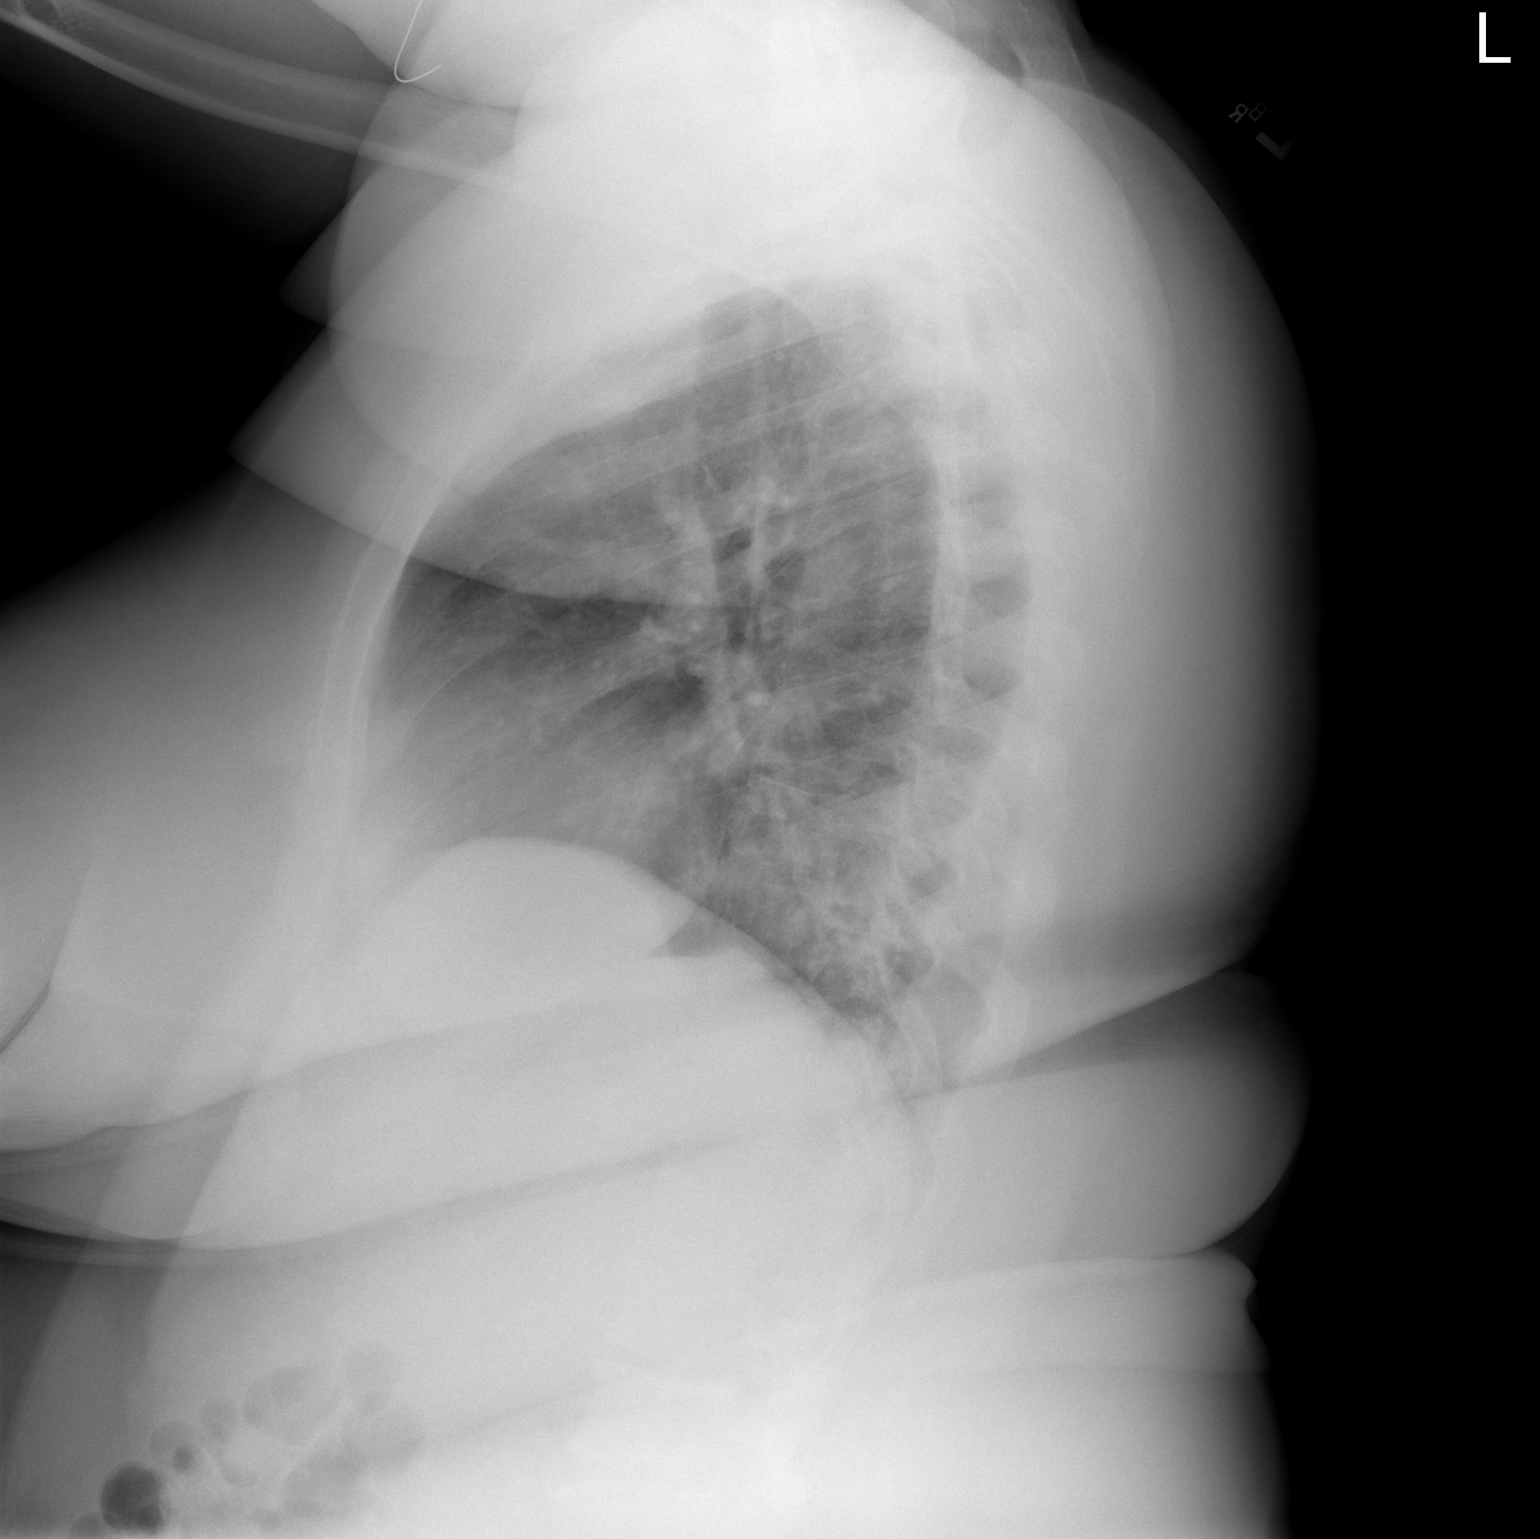

[2 of 2 positions shown; findings below may reference images not displayed]

FINDINGS: Haziness of the lower chest is symmetric and best attributed to soft
tissue attenuation. There is no edema, air bronchogram, effusion, or
pneumothorax. Normal heart size and mediastinal contours. Thoracic
scoliosis.
IMPRESSION: Negative for pneumonia.

## 2020-02-27 ENCOUNTER — Telehealth: Payer: Self-pay | Admitting: Family Medicine

## 2020-02-27 NOTE — Telephone Encounter (Signed)
Patient called states her WC case mgr took her from [facility] & sent her to [facility] for physical therapy (she only went there 2 wks) any has been denied any further (approved rehab).  -Pt wants to know how many session did Dr.Schmitz order? & has he been receiving PT notes from rehab providers??? & what is the update/ next step (Please advise)  --Forwarding request to med asst to review w/ Dr.Schmitz & respond.  -glh

## 2020-03-01 NOTE — Telephone Encounter (Signed)
Informed that PT should be continued and we would get further imaging if she fails to progress.   Myra Rude, MD [facility] 03/01/2020, 11:56 AM

## 2020-03-26 ENCOUNTER — Other Ambulatory Visit: Payer: Managed Care, Other (non HMO)

## 2020-03-26 ENCOUNTER — Other Ambulatory Visit: Payer: Self-pay

## 2020-03-26 ENCOUNTER — Other Ambulatory Visit (HOSPITAL_COMMUNITY)
Admission: RE | Admit: 2020-03-26 | Discharge: 2020-03-26 | Disposition: A | Discharge status: Home / Self Care | Payer: Managed Care, Other (non HMO) | Source: Ambulatory Visit | Attending: Family Medicine | Admitting: Family Medicine

## 2020-03-26 VITALS — BP 128/87 | HR 67 | Wt 227.0 lb

## 2020-03-26 DIAGNOSIS — N898 Other specified noninflammatory disorders of vagina: Secondary | ICD-10-CM | POA: Insufficient documentation

## 2020-03-26 NOTE — Progress Notes (Unsigned)
Patient states she wants a swab for her PH balance. Patient made aware we can do a self swab and will let her know results on Monday. Armandina Stammer RN

## 2020-03-29 ENCOUNTER — Other Ambulatory Visit: Payer: Self-pay | Admitting: Obstetrics & Gynecology

## 2020-03-29 DIAGNOSIS — B3731 Acute candidiasis of vulva and vagina: Secondary | ICD-10-CM

## 2020-03-29 LAB — CERVICOVAGINAL ANCILLARY ONLY
Bacterial Vaginitis (gardnerella): NEGATIVE
Candida Glabrata: NEGATIVE
Candida Vaginitis: POSITIVE — AB
Comment: NEGATIVE
Comment: NEGATIVE
Comment: NEGATIVE

## 2020-03-29 MED ORDER — TERCONAZOLE 0.4 % VA CREA: 1.0000 | TOPICAL_CREAM | Freq: Every day | VAGINAL | 0 refills | Status: DC

## 2020-04-22 ENCOUNTER — Telehealth: Payer: Self-pay | Admitting: General Practice

## 2020-04-22 NOTE — Telephone Encounter (Signed)
Unable to leave message on VM to inform patient of appointment change.  Pt has been rescheduled to 05/21/2020 at 8:30am due to inclement weather.  Mychart message sent to patient.

## 2020-04-26 ENCOUNTER — Ambulatory Visit: Payer: Managed Care, Other (non HMO) | Admitting: Obstetrics & Gynecology

## 2020-05-19 ENCOUNTER — Telehealth: Payer: Self-pay | Admitting: Family Medicine

## 2020-05-19 NOTE — Telephone Encounter (Signed)
Sharlotte Alamo @ [facility] carrier) called to see if pt has been cleared to return to full duty (she has completed PT w/ [facility] back on 03/11/20 .)  --They want to know if pt needs / has a f/u appt scheduled w/ Provider (pls advise if one is needed.  --Fowarding message to provider for review & instruction.  --glh

## 2020-05-21 ENCOUNTER — Ambulatory Visit: Payer: Self-pay | Admitting: Obstetrics & Gynecology

## 2020-06-05 ENCOUNTER — Encounter (HOSPITAL_COMMUNITY): Payer: Self-pay

## 2020-06-05 ENCOUNTER — Emergency Department (HOSPITAL_COMMUNITY): Payer: Self-pay

## 2020-06-05 ENCOUNTER — Emergency Department (HOSPITAL_COMMUNITY)
Admission: EM | Admit: 2020-06-05 | Discharge: 2020-06-05 | Disposition: A | Discharge status: Home / Self Care | Payer: Self-pay | Attending: Emergency Medicine | Admitting: Emergency Medicine

## 2020-06-05 ENCOUNTER — Other Ambulatory Visit: Payer: Self-pay

## 2020-06-05 DIAGNOSIS — J449 Chronic obstructive pulmonary disease, unspecified: Secondary | ICD-10-CM | POA: Insufficient documentation

## 2020-06-05 DIAGNOSIS — S0181XA Laceration without foreign body of other part of head, initial encounter: Secondary | ICD-10-CM

## 2020-06-05 DIAGNOSIS — Y93G3 Activity, cooking and baking: Secondary | ICD-10-CM | POA: Insufficient documentation

## 2020-06-05 DIAGNOSIS — Z79899 Other long term (current) drug therapy: Secondary | ICD-10-CM | POA: Insufficient documentation

## 2020-06-05 DIAGNOSIS — R569 Unspecified convulsions: Secondary | ICD-10-CM | POA: Insufficient documentation

## 2020-06-05 DIAGNOSIS — F17290 Nicotine dependence, other tobacco product, uncomplicated: Secondary | ICD-10-CM | POA: Insufficient documentation

## 2020-06-05 DIAGNOSIS — W1809XA Striking against other object with subsequent fall, initial encounter: Secondary | ICD-10-CM | POA: Insufficient documentation

## 2020-06-05 DIAGNOSIS — Z23 Encounter for immunization: Secondary | ICD-10-CM | POA: Insufficient documentation

## 2020-06-05 DIAGNOSIS — Y92000 Kitchen of unspecified non-institutional (private) residence as  the place of occurrence of the external cause: Secondary | ICD-10-CM | POA: Insufficient documentation

## 2020-06-05 DIAGNOSIS — S01112A Laceration without foreign body of left eyelid and periocular area, initial encounter: Secondary | ICD-10-CM | POA: Insufficient documentation

## 2020-06-05 HISTORY — DX: Chronic obstructive pulmonary disease, unspecified: J44.9

## 2020-06-05 HISTORY — DX: Anemia, unspecified: D64.9

## 2020-06-05 LAB — COMPREHENSIVE METABOLIC PANEL
ALT: 11 U/L (ref 0–44)
AST: 22 U/L (ref 15–41)
Albumin: 3.5 g/dL (ref 3.5–5.0)
Alkaline Phosphatase: 43 U/L (ref 38–126)
Anion gap: 10 (ref 5–15)
BUN: 6 mg/dL (ref 6–20)
CO2: 22 mmol/L (ref 22–32)
Calcium: 9.1 mg/dL (ref 8.9–10.3)
Chloride: 105 mmol/L (ref 98–111)
Creatinine, Ser: 0.81 mg/dL (ref 0.44–1.00)
GFR, Estimated: >60 mL/min (ref >60)
Glucose, Bld: 100 mg/dL — ABNORMAL HIGH (ref 70–99)
Potassium: 3.3 mmol/L — ABNORMAL LOW (ref 3.5–5.1)
Sodium: 137 mmol/L (ref 135–145)
Total Bilirubin: 0.9 mg/dL (ref 0.3–1.2)
Total Protein: 6.8 g/dL (ref 6.5–8.1)

## 2020-06-05 LAB — CBC WITH DIFFERENTIAL/PLATELET
Abs Immature Granulocytes: 0.04 10*3/uL (ref 0.00–0.07)
Basophils Absolute: 0 10*3/uL (ref 0.0–0.1)
Basophils Relative: 0 %
Eosinophils Absolute: 0 10*3/uL (ref 0.0–0.5)
Eosinophils Relative: 0 %
HCT: 35.7 % — ABNORMAL LOW (ref 36.0–46.0)
Hemoglobin: 11.2 g/dL — ABNORMAL LOW (ref 12.0–15.0)
Immature Granulocytes: 0 %
Lymphocytes Relative: 23 %
Lymphs Abs: 2.1 10*3/uL (ref 0.7–4.0)
MCH: 25.1 pg — ABNORMAL LOW (ref 26.0–34.0)
MCHC: 31.4 g/dL (ref 30.0–36.0)
MCV: 80 fL (ref 80.0–100.0)
Monocytes Absolute: 0.6 10*3/uL (ref 0.1–1.0)
Monocytes Relative: 7 %
Neutro Abs: 6.3 10*3/uL (ref 1.7–7.7)
Neutrophils Relative %: 70 %
Platelets: 414 10*3/uL — ABNORMAL HIGH (ref 150–400)
RBC: 4.46 MIL/uL (ref 3.87–5.11)
RDW: 16.8 % — ABNORMAL HIGH (ref 11.5–15.5)
WBC: 9.1 10*3/uL (ref 4.0–10.5)
nRBC: 0 % (ref 0.0–0.2)

## 2020-06-05 LAB — ETHANOL: Alcohol, Ethyl (B): <10 mg/dL (ref <10)

## 2020-06-05 LAB — URINALYSIS, ROUTINE W REFLEX MICROSCOPIC
Bilirubin Urine: NEGATIVE
Glucose, UA: NEGATIVE mg/dL
Hgb urine dipstick: NEGATIVE
Ketones, ur: NEGATIVE mg/dL
Leukocytes,Ua: NEGATIVE
Nitrite: NEGATIVE
Protein, ur: NEGATIVE mg/dL
Specific Gravity, Urine: 1.013 (ref 1.005–1.030)
pH: 6 (ref 5.0–8.0)

## 2020-06-05 LAB — RAPID URINE DRUG SCREEN, HOSP PERFORMED
Amphetamines: POSITIVE — AB
Barbiturates: NOT DETECTED
Benzodiazepines: NOT DETECTED
Cocaine: NOT DETECTED
Opiates: NOT DETECTED
Tetrahydrocannabinol: POSITIVE — AB

## 2020-06-05 LAB — I-STAT BETA HCG BLOOD, ED (MC, WL, AP ONLY): I-stat hCG, quantitative: <5 m[IU]/mL (ref <5)

## 2020-06-05 LAB — CBG MONITORING, ED: Glucose-Capillary: 85 mg/dL (ref 70–99)

## 2020-06-05 MED ORDER — LIDOCAINE HCL (PF) 1 % IJ SOLN
30.0000 mL | Freq: Once | INTRAMUSCULAR | Status: AC
  Administered 2020-06-05: 30 mL
  Filled 2020-06-05: qty 30

## 2020-06-05 MED ORDER — TETANUS-DIPHTH-ACELL PERTUSSIS 5-2.5-18.5 LF-MCG/0.5 IM SUSY
0.5000 mL | PREFILLED_SYRINGE | Freq: Once | INTRAMUSCULAR | Status: AC
  Administered 2020-06-05: 0.5 mL via INTRAMUSCULAR
  Filled 2020-06-05: qty 0.5

## 2020-06-05 MED ORDER — ACETAMINOPHEN 325 MG PO TABS
650.0000 mg | ORAL_TABLET | Freq: Once | ORAL | Status: AC
  Administered 2020-06-05: 650 mg via ORAL
  Filled 2020-06-05: qty 2

## 2020-06-05 MED ORDER — LORAZEPAM 2 MG/ML IJ SOLN
INTRAMUSCULAR | Status: AC
  Administered 2020-06-05: 1 mg via INTRAVENOUS
  Filled 2020-06-05: qty 1

## 2020-06-05 MED ORDER — LIDOCAINE-EPINEPHRINE-TETRACAINE (LET) TOPICAL GEL
3.0000 mL | Freq: Once | TOPICAL | Status: AC
  Administered 2020-06-05: 3 mL via TOPICAL
  Filled 2020-06-05: qty 3

## 2020-06-05 NOTE — Discharge Instructions (Addendum)
You are seen today for seizure-like activity after a fall. You are going to be a sore after this fall, which to take Tylenol instructed on the bottle for pain. I want you to ice your areas that are tender, they are going to be worse tomorrow. In regards to your wound care for the sutures on your face use the instructions below. In regards to your seizure-like activity, I want you to stop drinking alcohol and stay away from drugs until you see a neurologist, their information is above, please schedule an appointment with them. Please refrain from driving, climbing ladders, taking long showers alone, swimming or any other activities by yourself until cleared by a neurologist or your primary care provider. If you have any new or worsening concerning symptoms please come back to the emergency department. Please use the attached instructions.  WOUND CARE Please return in 7 days to have your stitches/staples removed or sooner if you have concerns.  Keep area clean and dry for 24 hours. Do not remove bandage, if applied.  After 24 hours, remove bandage and wash wound gently with mild soap and warm water. Reapply a new bandage after cleaning wound, if directed.  Continue daily cleansing with soap and water until stitches/staples are removed.  Do not apply any ointments or creams to the wound while stitches/staples are in place, as this may cause delayed healing.  Notify the office if you experience any of the following signs of infection: Swelling, redness, pus drainage, streaking, fever >101.0 F  Notify the office if you experience excessive bleeding that does not stop after 15-20 minutes of constant, firm pressure.

## 2020-06-05 NOTE — ED Triage Notes (Signed)
Pt BIB EMS. Pt had a sz, fell, and hit head on a counter. Laceration present above left eye. Significant other was present and reports that the pt starting convulsing (entire body) once hitting the ground; episode lasted about 1 1/2 min. Pt doesn't remember what happened; denies hx of sz. ETOH last night and this am. BGL 85 on arrival. C-collar in place.

## 2020-06-05 NOTE — ED Notes (Signed)
Patient transported to CT 
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
## 2020-06-22 ENCOUNTER — Other Ambulatory Visit: Payer: Self-pay

## 2020-06-22 ENCOUNTER — Ambulatory Visit (INDEPENDENT_AMBULATORY_CARE_PROVIDER_SITE_OTHER): Payer: Self-pay | Admitting: Primary Care

## 2020-06-22 ENCOUNTER — Encounter (INDEPENDENT_AMBULATORY_CARE_PROVIDER_SITE_OTHER): Payer: Self-pay | Admitting: Primary Care

## 2020-06-22 ENCOUNTER — Emergency Department (HOSPITAL_COMMUNITY)
Admission: EM | Admit: 2020-06-22 | Discharge: 2020-06-22 | Disposition: A | Discharge status: Home / Self Care | Payer: Self-pay | Attending: Emergency Medicine | Admitting: Emergency Medicine

## 2020-06-22 ENCOUNTER — Encounter (HOSPITAL_COMMUNITY): Payer: Self-pay | Admitting: Emergency Medicine

## 2020-06-22 VITALS — BP 117/82 | HR 68 | Temp 97.5°F | Ht 62.0 in | Wt 226.2 lb

## 2020-06-22 DIAGNOSIS — X58XXXD Exposure to other specified factors, subsequent encounter: Secondary | ICD-10-CM | POA: Insufficient documentation

## 2020-06-22 DIAGNOSIS — Z4801 Encounter for change or removal of surgical wound dressing: Secondary | ICD-10-CM

## 2020-06-22 DIAGNOSIS — S01112D Laceration without foreign body of left eyelid and periocular area, subsequent encounter: Secondary | ICD-10-CM | POA: Insufficient documentation

## 2020-06-22 DIAGNOSIS — Z09 Encounter for follow-up examination after completed treatment for conditions other than malignant neoplasm: Secondary | ICD-10-CM

## 2020-06-22 DIAGNOSIS — Z91040 Latex allergy status: Secondary | ICD-10-CM | POA: Insufficient documentation

## 2020-06-22 DIAGNOSIS — Z4802 Encounter for removal of sutures: Secondary | ICD-10-CM | POA: Insufficient documentation

## 2020-06-22 DIAGNOSIS — F17290 Nicotine dependence, other tobacco product, uncomplicated: Secondary | ICD-10-CM | POA: Insufficient documentation

## 2020-06-22 DIAGNOSIS — J449 Chronic obstructive pulmonary disease, unspecified: Secondary | ICD-10-CM | POA: Insufficient documentation

## 2020-06-22 DIAGNOSIS — R55 Syncope and collapse: Secondary | ICD-10-CM

## 2020-06-22 NOTE — ED Notes (Signed)
Pt left without paperwork

## 2020-06-22 NOTE — ED Triage Notes (Signed)
Pt states she is here for suture removal from L eyebrow that have been in place x 1 month.

## 2020-06-22 NOTE — ED Provider Notes (Signed)
[facility] EMERGENCY DEPARTMENT Provider Note   CSN: [id number] Arrival date & time: 06/22/20  1221     History No chief complaint on file.   Maria Mccarthy is a 35 y.o. female.  35 year old female presents for suture removal from left eyebrow, placed on 06/05/2020.  Denies complaints or complications with the wound.        Past Medical History:  Diagnosis Date  . Anemia   . COPD (chronic obstructive pulmonary disease) [facility])     Patient Active Problem List   Diagnosis Date Noted  . Quadriceps tendinitis 12/11/2019  . DUB (dysfunctional uterine bleeding) 09/11/2013    Past Surgical History:  Procedure Laterality Date  . NO PAST SURGERIES       OB History    Gravida  3   Para      Term      Preterm      AB  3   Living  0     SAB  0   IAB      Ectopic      Multiple      Live Births              Family History  Problem Relation Age of Onset  . Mental illness Mother   . Diabetes Maternal Grandmother   . Hypertension Maternal Grandmother   . Diabetes Maternal Grandfather   . Diabetes Paternal Grandfather     Social History   Tobacco Use  . Smoking status: Current Every Day Smoker    Packs/day: 0.25    Types: Cigars  . Smokeless tobacco: Never Used  Substance Use Topics  . Alcohol use: Yes    Alcohol/week: 2.0 standard drinks    Types: 2 Shots of liquor per week    Comment: Occ  . Drug use: Yes    Types: Marijuana    Home Medications Prior to Admission medications   Not on File    Allergies    Latex  Review of Systems   Review of Systems  Constitutional: Negative for fever.  Skin: Positive for wound.  Allergic/Immunologic: Negative for immunocompromised state.    Physical Exam Updated Vital Signs BP 120/75 (BP Location: Left Arm)   Pulse 66   Temp 98.6 F (37 C)   Resp 18   LMP 05/27/2020 (Exact Date)   SpO2 100%   Physical Exam Vitals and nursing note reviewed.  Constitutional:       General: She is not in acute distress.    Appearance: She is well-developed. She is not diaphoretic.  HENT:     Head: Normocephalic and atraumatic.  Pulmonary:     Effort: Pulmonary effort is normal.  Skin:    Comments: Sutures in place in left eyebrow  Neurological:     Mental Status: She is alert and oriented to person, place, and time.  Psychiatric:        Behavior: Behavior normal.     ED Results / Procedures / Treatments   Labs (all labs ordered are listed, but only abnormal results are displayed) Labs Reviewed - No data to display  EKG None  Radiology No results found.  Procedures .Suture Removal  Date/Time: 06/22/2020 1:43 PM Performed by: Jeannie Fend, PA-C Authorized by: Jeannie Fend, PA-C   Consent:    Consent obtained:  Verbal   Consent given by:  Patient   Risks, benefits, and alternatives were discussed: yes     Risks discussed:  Bleeding, pain and  wound separation   Alternatives discussed:  No treatment Universal protocol:    Patient identity confirmed:  Verbally with patient Location:    Location:  Head/neck   Head/neck location:  Eyebrow   Eyebrow location:  L eyebrow Procedure details:    Wound appearance:  No signs of infection   Number of sutures removed:  5 Post-procedure details:    Post-removal:  No dressing applied   Procedure completion:  Tolerated well, no immediate complications     Medications Ordered in ED Medications - No data to display  ED Course  I have reviewed the triage vital signs and the nursing notes.  Pertinent labs & imaging results that were available during my care of the patient were reviewed by me and considered in my medical decision making (see chart for details).  Clinical Course as of 06/22/20 1344  Tue Jun 22, 2020  211 35 year old female with request for suture removal from left eyebrow.  Wound appears to be healing well, 5 sutures were removed with some difficulty given the length of time they  have been in place however patient tolerated well.  Discussed aftercare. [LM]    Clinical Course User Index [LM] Alden Hipp   MDM Rules/Calculators/A&P                         Final Clinical Impression(s) / ED Diagnoses Final diagnoses:  Visit for suture removal    Rx / DC Orders ED Discharge Orders    None       Jeannie Fend, PA-C 06/22/20 1344    Lorre Nick, MD 06/23/20 1515

## 2020-06-22 NOTE — Progress Notes (Unsigned)
CC: Hospital Follow up   HPI Ms. Lyne Cartier female presents for follow up from the emergency room on 06/05/20, in which she sustained a fall with lacerations and stitches done.  This appointment she thought and her boyfriend was for neurology to determine the causes of seizures and removal of stitches.  Stitches have been embedded and healing over unable to remove them.  Patient also has a primary care and is unsure why she was given this appointment.  Past Medical History:  Diagnosis Date  . Anemia   . COPD (chronic obstructive pulmonary disease) (HCC)      Allergies  Allergen Reactions  . Latex Swelling and Rash      No current outpatient medications on file prior to visit.   No current facility-administered medications on file prior to visit.    ROS:  Review of Systems  Neurological: Positive for dizziness and headaches.       Headaches and dizziness intermittent unable to associate cause   Physical Exam: Filed Weights   06/22/20 1123  Weight: 226 lb 3.2 oz (102.6 kg)   BP 117/82 (BP Location: Right Arm, Patient Position: Sitting, Cuff Size: Large)   Pulse 68   Temp (!) 97.5 F (36.4 C) (Temporal)   Ht 5\' 2"  (1.575 m)   Wt 226 lb 3.2 oz (102.6 kg)   LMP 05/27/2020 (Exact Date)   SpO2 98%   BMI 41.37 kg/m  General Appearance: Well nourished,morbid obese female  in no apparent distress. Eyes: PERRLA, EOMs, conjunctiva no swelling or erythema Sinuses: No Frontal/maxillary tenderness ENT/Mouth: Ext aud canals clear, TMs without erythema, bulging. No erythema, swelling, or exudate on post pharynx.  Tonsils not swollen or erythematous. Hearing normal.  Neck: Supple, thick thyroid normal.  Respiratory: Respiratory effort normal, BS equal bilaterally without rales, rhonchi, wheezing or stridor.  Cardio: RRR with no MRGs. Brisk peripheral pulses without edema.  Abdomen: Soft, + BS.  Non tender, no guarding, rebound, hernias, masses. Lymphatics: Non tender without  lymphadenopathy.  Musculoskeletal: Full ROM, 5/5 strength, normal gait.  Skin: Warm, dry without rashes,imbeded stiches over left eye brow  Neuro: Cranial nerves intact. Normal muscle tone, no cerebellar symptoms. Sensation intact.  Psych: Awake and oriented X 3, normal affect, Insight and Judgment appropriate.    Rhiannan was seen today for hospitalization follow-up.  Diagnoses and all orders for this visit:  Syncope and collapse -     Ambulatory referral to Neurology  Hospital discharge follow-up Sutures placed over left eye brow unable to find return date for remove. Sutures embedded/healed over eye brow Deferred to ED  Per hospital discharge  1 Follow up with [facility]; For Follow Up 2 Follow up with [facility]; For Follow Up     Merita Norton, NP 11:45 AM

## 2020-06-24 ENCOUNTER — Ambulatory Visit (INDEPENDENT_AMBULATORY_CARE_PROVIDER_SITE_OTHER): Payer: Worker's Compensation | Admitting: Family Medicine

## 2020-06-24 ENCOUNTER — Other Ambulatory Visit: Payer: Self-pay

## 2020-06-24 ENCOUNTER — Encounter: Payer: Self-pay | Admitting: Family Medicine

## 2020-06-24 DIAGNOSIS — M76899 Other specified enthesopathies of unspecified lower limb, excluding foot: Secondary | ICD-10-CM | POA: Diagnosis not present

## 2020-06-24 NOTE — Assessment & Plan Note (Addendum)
Acute on chronic in nature. Occurring after an injury at work.  Pain is still ongoing despite treatment thus far.  Of tried medications, bracing and physical therapy. -Counseled on home exercise therapy and supportive care - MRI to evaluate the quad tendon and the patellofemoral joint.

## 2020-06-24 NOTE — Progress Notes (Signed)
  Camren Henthorn - 35 y.o. female MRN [id number]  Date of birth: Aug 31, 1985  SUBJECTIVE:  Including CC & ROS.  No chief complaint on file.   Blaise Palladino is a 35 y.o. female that is presenting with acute on chronic left knee pain.  She had knee pain after an injury sustained at work last September.  She has been through physical therapy and tried a brace and medications.  Her pain now is intermittent in nature.  She does have giving way from time to time.   Review of Systems See HPI   HISTORY: Past Medical, Surgical, Social, and Family History Reviewed & Updated per EMR.   Pertinent Historical Findings include:  Past Medical History:  Diagnosis Date  . Anemia   . COPD (chronic obstructive pulmonary disease) (HCC)     Past Surgical History:  Procedure Laterality Date  . NO PAST SURGERIES      Family History  Problem Relation Age of Onset  . Mental illness Mother   . Diabetes Maternal Grandmother   . Hypertension Maternal Grandmother   . Diabetes Maternal Grandfather   . Diabetes Paternal Grandfather     Social History   Socioeconomic History  . Marital status: Single    Spouse name: Not on file  . Number of children: Not on file  . Years of education: Not on file  . Highest education level: Not on file  Occupational History  . Not on file  Tobacco Use  . Smoking status: Current Every Day Smoker    Packs/day: 0.25    Types: Cigars  . Smokeless tobacco: Never Used  Substance and Sexual Activity  . Alcohol use: Yes    Alcohol/week: 2.0 standard drinks    Types: 2 Shots of liquor per week    Comment: Occ  . Drug use: Yes    Types: Marijuana  . Sexual activity: Yes    Birth control/protection: None  Other Topics Concern  . Not on file  Social History Narrative  . 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 Occurring after an injury at work.  Pain is still ongoing despite treatment thus far.  Of tried medications, bracing and physical therapy. -Counseled on home exercise therapy and supportive care - MRI to evaluate the quad tendon and the patellofemoral joint.

## 2020-06-24 NOTE — Patient Instructions (Signed)
Good to see you  Please send me a message in MyChart with any questions or updates.  We will setup a virtual visit once the MRI is resulted.   --Dr. Jordan Likes

## 2020-07-22 ENCOUNTER — Encounter: Payer: Self-pay | Admitting: Neurology

## 2020-07-22 ENCOUNTER — Ambulatory Visit: Payer: Self-pay | Admitting: Neurology

## 2020-07-28 ENCOUNTER — Other Ambulatory Visit (HOSPITAL_COMMUNITY)
Admission: RE | Admit: 2020-07-28 | Discharge: 2020-07-28 | Disposition: A | Discharge status: Home / Self Care | Payer: BC Managed Care – PPO | Source: Ambulatory Visit | Attending: Obstetrics & Gynecology | Admitting: Obstetrics & Gynecology

## 2020-07-28 ENCOUNTER — Encounter: Payer: Self-pay | Admitting: Obstetrics & Gynecology

## 2020-07-28 ENCOUNTER — Telehealth: Payer: Self-pay | Admitting: General Practice

## 2020-07-28 ENCOUNTER — Ambulatory Visit (INDEPENDENT_AMBULATORY_CARE_PROVIDER_SITE_OTHER): Payer: BC Managed Care – PPO | Admitting: Obstetrics & Gynecology

## 2020-07-28 ENCOUNTER — Other Ambulatory Visit: Payer: Self-pay

## 2020-07-28 VITALS — BP 135/85 | HR 79 | Ht 63.0 in | Wt 228.0 lb

## 2020-07-28 DIAGNOSIS — N898 Other specified noninflammatory disorders of vagina: Secondary | ICD-10-CM | POA: Diagnosis not present

## 2020-07-28 DIAGNOSIS — Z01419 Encounter for gynecological examination (general) (routine) without abnormal findings: Secondary | ICD-10-CM | POA: Diagnosis not present

## 2020-07-28 DIAGNOSIS — N946 Dysmenorrhea, unspecified: Secondary | ICD-10-CM

## 2020-07-28 NOTE — Telephone Encounter (Signed)
Patient was given Administrator.

## 2020-07-28 NOTE — Progress Notes (Signed)
Subjective:     Maria Mccarthy is a 35 y.o. female here for a routine exam.  Current complaints: Pt reports that her last cycle was heavy and very painful and assoc with clots. Pt is recently married x 16 months and would like to conceive. Her partner is with her this visit. Pt also reports a discharge that she believes is yeast. She denies itching or odor.     She reports that she had an accident and a knee injury and following that passed out and sustained a head injury. She is now out of work waiting to see a neurologist.        Gynecologic History Patient's last menstrual period was 06/24/2020. Contraception: none Last Pap: 09/21/2016. Results were: normal Last mammogram: n/a.   Obstetric History OB History  Gravida Para Term Preterm AB Living  3       3 0  SAB IAB Ectopic Multiple Live Births  0            # Outcome Date GA Lbr Len/2nd Weight Sex Delivery Anes PTL Lv  3 AB           2 AB           1 AB            The following portions of the patient's history were reviewed and updated as appropriate: allergies, current medications, past family history, past medical history, past social history, past surgical history and problem list.  Review of Systems Pertinent items are noted in HPI.    Objective:  BP 135/85   Pulse 79   Ht 5\' 3"  (1.6 m)   Wt 228 lb (103.4 kg)   LMP 06/24/2020   BMI 40.39 kg/m  General Appearance:    Alert, cooperative, no distress, appears stated age  Head:    Normocephalic, without obvious abnormality, atraumatic  Eyes:    conjunctiva/corneas clear, EOM's intact, both eyes  Ears:    Normal external ear canals, both ears  Nose:   Nares normal, septum midline, mucosa normal, no drainage    or sinus tenderness  Throat:   Lips, mucosa, and tongue normal; teeth and gums normal  Neck:   Supple, symmetrical, trachea midline, no adenopathy;    thyroid:  no enlargement/tenderness/nodules  Back:     Symmetric, no curvature, ROM normal, no CVA tenderness   Lungs:     respirations unlabored  Chest Wall:    No tenderness or deformity   Heart:    Regular rate and rhythm  Breast Exam:    No tenderness, masses, or nipple abnormality  Abdomen:     Soft, non-tender, bowel sounds active all four quadrants,    no masses, no organomegaly  Genitalia:    Normal female without lesion, discharge or tenderness   Uterus: Small but, nodular.   Extremities:   Extremities normal, atraumatic, no cyanosis or edema  Pulses:   2+ and symmetric all extremities  Skin:   Skin color, texture, turgor normal, no rashes or lesions     Assessment:    Healthy female exam.    Plan:   Maria Mccarthy was seen today for gynecologic exam.  Diagnoses and all orders for this visit:  Well female exam with routine gynecological exam -     Cytology - PAP( [place]) -     Cervicovaginal ancillary only( [place])  Menorrhalgia -     Maria Mccarthy Pelvis Complete; Future -     US Transvaginal Non-OB; Future  Vaginal discharge -     Cervicovaginal ancillary only( [place])  Rec Midol or OTC NSAIDs with dysmenorrhea. Call if sx persist.  Rec that pt begin PNV.  F/u in 1 year or sooner prn    Maria Mccarthy L. Harraway-Smith, M.D., [place]

## 2020-07-30 LAB — CERVICOVAGINAL ANCILLARY ONLY
Bacterial Vaginitis (gardnerella): POSITIVE — AB
Candida Glabrata: NEGATIVE
Candida Vaginitis: NEGATIVE
Chlamydia: NEGATIVE
Comment: NEGATIVE
Comment: NEGATIVE
Comment: NEGATIVE
Comment: NEGATIVE
Comment: NEGATIVE
Comment: NORMAL
Neisseria Gonorrhea: NEGATIVE
Trichomonas: NEGATIVE

## 2020-08-02 ENCOUNTER — Other Ambulatory Visit: Payer: Self-pay | Admitting: Obstetrics & Gynecology

## 2020-08-02 DIAGNOSIS — B9689 Other specified bacterial agents as the cause of diseases classified elsewhere: Secondary | ICD-10-CM

## 2020-08-02 LAB — CYTOLOGY - PAP
Comment: NEGATIVE
Diagnosis: NEGATIVE
High risk HPV: NEGATIVE

## 2020-08-02 MED ORDER — METRONIDAZOLE 500 MG PO TABS: 500.0000 mg | ORAL_TABLET | Freq: Two times a day (BID) | ORAL | 0 refills | Status: DC

## 2020-08-04 ENCOUNTER — Encounter (INDEPENDENT_AMBULATORY_CARE_PROVIDER_SITE_OTHER): Payer: Self-pay | Admitting: Primary Care

## 2020-08-04 ENCOUNTER — Telehealth (INDEPENDENT_AMBULATORY_CARE_PROVIDER_SITE_OTHER): Payer: BC Managed Care – PPO | Admitting: Primary Care

## 2020-08-04 DIAGNOSIS — M76899 Other specified enthesopathies of unspecified lower limb, excluding foot: Secondary | ICD-10-CM

## 2020-08-04 DIAGNOSIS — G43009 Migraine without aura, not intractable, without status migrainosus: Secondary | ICD-10-CM | POA: Diagnosis not present

## 2020-08-04 DIAGNOSIS — G4700 Insomnia, unspecified: Secondary | ICD-10-CM

## 2020-08-04 MED ORDER — NAPROXEN SODIUM ER 500 MG PO TB24: 500.0000 mg | ORAL_TABLET | Freq: Every day | ORAL | 0 refills | Status: DC

## 2020-08-04 NOTE — Progress Notes (Signed)
Virtual Visit via Telephone Note  I connected with Maria Mccarthy, on 08/04/2020 at 9:57 AM by telephone verified that I am speaking with the correct person using two identifiers.   Consent: I discussed the limitations, risks, security and privacy concerns of performing an evaluation and management service by telephone and the availability of in person appointments. I also discussed with the patient that there may be a patient responsible charge related to this service. The patient expressed understanding and agreed to proceed.  Location of Patient: in her car  Location of Provider: [facility]   Persons participating in Telemedicine visit: Merita Norton Tague Gwinda Passe,  NP Cristela Felt , CMA  History of Present Illness:  Maria Mccarthy is a 35 year old female having a virtual visit for migraines - no aura temple left side and occipital area. 6/10- 10/10. She had an incident where she became very angry and agitated headache already present and she had a syncope episode EMS but did not go. She has an referral place for neurology but not until July - [facility]. Left knee pain - Quadriceps tendinitis- was followed by ortho and she has expected this to heal by now.      Past Medical History:  Diagnosis Date  . Anemia   . COPD (chronic obstructive pulmonary disease) (HCC)    Allergies  Allergen Reactions  . Latex Swelling and Rash    Current Outpatient Medications on File Prior to Visit  Medication Sig Dispense Refill  . Acetaminophen (TYLENOL PO) Take by mouth as needed.    . metroNIDAZOLE (FLAGYL) 500 MG tablet Take 1 tablet (500 mg total) by mouth 2 (two) times daily. 14 tablet 0   No current facility-administered medications on file prior to visit.    Observations/Objective: Depressed-Homeless loss her house due to loss of job getting ready loose car  Assessment and Plan: Diagnoses and all orders for  this visit:  Quadriceps tendinitis Work on losing weight to help reduce joint pain. May alternate with heat and ice application for pain relief. May also alternate with acetaminophen and Ibuprofen as prescribed pain relief. Other alternatives include massage, acupuncture and water aerobics.  You must stay active and avoid a sedentary lifestyle. -     naproxen (NAPRELAN) 500 MG 24 hr tablet; Take 1 tablet (500 mg total) by mouth daily with breakfast.  Migraine without aura and without status migrainosus, not intractable -     naproxen (NAPRELAN) 500 MG 24 hr tablet; Take 1 tablet (500 mg total) by mouth daily with breakfast.  Morbid obesity (HCC) Morbid Obesity is> 40 indicating an excess in caloric intake or underlining conditions. This may lead to other co-morbidities. Lifestyle modifications of diet and exercise may reduce obesity.   Insomnia, unspecified type Increase stress and depression. Fear of loosing her car / home for the moment . May use benadryl or melatonin.   Follow Up Instructions:    I discussed the assessment and treatment plan with the patient. The patient was provided an opportunity to ask questions and all were answered. The patient agreed with the plan and demonstrated an understanding of the instructions.   The patient was advised to call back or seek an in-person evaluation if the symptoms worsen or if the condition fails to improve as anticipated.     I provided 22 minutes total of non-face-to-face time during this encounter including median intraservice time, reviewing previous notes, investigations, ordering medications, medical decision  making, coordinating care and patient verbalized understanding at the end of the visit.     Grayce Sessions, NP. 08/04/2020, 9:57 AM

## 2020-08-04 NOTE — Patient Instructions (Signed)
Magnesium can be used as preventive treatment for migraine and other types of headaches. It has many benefits of relatively side-effect free, safe in pregnancy, relatively inexpensive, highly effective in some patients. Magnesium oxide is used typically 400-600 mg/day. It can cause diarrhea in some pts. Magnesium glycinate is better tolerated by GI system but it is hard to find (may have to look up online or health food store). We gave printed material if needed.  OR  MIGRAINE TREATMENT   Treatment of Acute Headaches 1. Your medication for acute headache is  2. You should take the medication as soon as you feel a migraine is coming. 3. This means you should always have some medication with you, either in your workplace, car, or bag.  4. You can either take a half or a whole tablet depending on how you perceive the severity of headache will be.  5. You can take this with NSAIDs to increase potency.  6. You should not take this medication more than 9 a month due to possibility of rebound headaches.  Prevention of Headaches 1. Make sure you identify potential triggers and take steps to avoid them or reduce exposure to them. Potential triggers include: change in caffeine intake, stress, lack of sleep, fatigue. 2. Take vitamin b2 400 mg daily. It may take up to 3 months to work but this may reduce the intensity and frequency of your headaches. 3. If the vitamin b2 is not sufficient, you can add or replace these other supplements: feverfew, butterbur, or magnesium. 4. Follow-up with me in 6-8 weeks so we can continue to fine tune your treatment.

## 2020-08-05 ENCOUNTER — Ambulatory Visit (HOSPITAL_BASED_OUTPATIENT_CLINIC_OR_DEPARTMENT_OTHER): Admission: RE | Admit: 2020-08-05 | Payer: Self-pay | Source: Ambulatory Visit

## 2020-08-05 ENCOUNTER — Ambulatory Visit (HOSPITAL_BASED_OUTPATIENT_CLINIC_OR_DEPARTMENT_OTHER): Payer: Self-pay

## 2020-08-13 ENCOUNTER — Ambulatory Visit (HOSPITAL_BASED_OUTPATIENT_CLINIC_OR_DEPARTMENT_OTHER): Payer: Self-pay | Attending: Obstetrics & Gynecology

## 2020-08-13 ENCOUNTER — Encounter: Payer: Self-pay | Admitting: Family Medicine

## 2020-08-13 ENCOUNTER — Ambulatory Visit (HOSPITAL_BASED_OUTPATIENT_CLINIC_OR_DEPARTMENT_OTHER): Admission: RE | Admit: 2020-08-13 | Payer: Self-pay | Source: Ambulatory Visit

## 2020-08-13 ENCOUNTER — Encounter (HOSPITAL_BASED_OUTPATIENT_CLINIC_OR_DEPARTMENT_OTHER): Payer: Self-pay

## 2020-08-24 ENCOUNTER — Other Ambulatory Visit: Payer: Self-pay | Admitting: Family Medicine

## 2020-08-24 DIAGNOSIS — M76899 Other specified enthesopathies of unspecified lower limb, excluding foot: Secondary | ICD-10-CM

## 2020-08-25 ENCOUNTER — Telehealth: Payer: Self-pay | Admitting: Family Medicine

## 2020-08-25 NOTE — Telephone Encounter (Signed)
FYI  Received notice from [facility] Intake(3rd party) scheduling for pt's Workers Comp carrier. P) [phone]  F) [phone]  ----  Patient did NOT attend scheduled  08/13/20  MRI @ [facility]  [address] [place], [place] --scheduled @ 9:00 am.   p) [phone]   f) [phone]   Per scheduling office they will make attempt to Reschedule :  CPT 73721 lower extremity w/o dye (MRI w/o L knee.  glh

## 2020-08-31 ENCOUNTER — Encounter: Payer: Self-pay | Admitting: Family Medicine

## 2020-09-21 ENCOUNTER — Ambulatory Visit (HOSPITAL_BASED_OUTPATIENT_CLINIC_OR_DEPARTMENT_OTHER)
Admission: RE | Admit: 2020-09-21 | Discharge: 2020-09-21 | Disposition: A | Discharge status: Home / Self Care | Payer: Self-pay | Source: Ambulatory Visit | Attending: Obstetrics & Gynecology | Admitting: Obstetrics & Gynecology

## 2020-09-21 ENCOUNTER — Encounter (HOSPITAL_BASED_OUTPATIENT_CLINIC_OR_DEPARTMENT_OTHER): Payer: Self-pay

## 2020-09-21 ENCOUNTER — Telehealth: Payer: Self-pay

## 2020-09-21 ENCOUNTER — Other Ambulatory Visit: Payer: Self-pay

## 2020-09-21 DIAGNOSIS — N946 Dysmenorrhea, unspecified: Secondary | ICD-10-CM

## 2020-09-21 NOTE — Telephone Encounter (Signed)
Received a call from [facility] in Radiology. Maria Mccarthy states that during the pt's ultrasound, the radiology tech though she saw an ectopic or a possible mass at the pt's right adnexal region. Pt was advised to take a preg test but pt was sent on her way because the office was closed for lunch. I attempted to call pt but her phone went to vm. I will send pt a Mychart message. Cashel Bellina l Ethelbert Thain, CMA

## 2020-09-22 ENCOUNTER — Ambulatory Visit (INDEPENDENT_AMBULATORY_CARE_PROVIDER_SITE_OTHER): Payer: Medicaid Other

## 2020-09-22 VITALS — BP 111/77 | HR 78 | Wt 216.0 lb

## 2020-09-22 DIAGNOSIS — Z3202 Encounter for pregnancy test, result negative: Secondary | ICD-10-CM | POA: Diagnosis not present

## 2020-09-22 LAB — POCT URINE PREGNANCY: Preg Test, Ur: NEGATIVE

## 2020-09-22 NOTE — Progress Notes (Signed)
Pt presents for UPT. Per Radiology,pt has possible ectopic/mass. UPT is negative.  Joycelin Radloff l Jahanna Raether, CMA

## 2020-10-04 ENCOUNTER — Encounter: Payer: Self-pay | Admitting: Family Medicine

## 2020-10-05 ENCOUNTER — Encounter: Payer: Self-pay | Admitting: Family Medicine

## 2020-10-05 ENCOUNTER — Telehealth (INDEPENDENT_AMBULATORY_CARE_PROVIDER_SITE_OTHER): Payer: Worker's Compensation | Admitting: Family Medicine

## 2020-10-05 ENCOUNTER — Other Ambulatory Visit: Payer: Self-pay

## 2020-10-05 DIAGNOSIS — M76899 Other specified enthesopathies of unspecified lower limb, excluding foot: Secondary | ICD-10-CM

## 2020-10-05 DIAGNOSIS — M2242 Chondromalacia patellae, left knee: Secondary | ICD-10-CM | POA: Diagnosis not present

## 2020-10-05 NOTE — Assessment & Plan Note (Signed)
Observed on her MRI.  Could be playing of component of her pain. -Counseled on home exercise therapy and supportive care. -Could consider injection.

## 2020-10-05 NOTE — Assessment & Plan Note (Signed)
MRI was demonstrating moderate extensor tendinosis.  Following injury sustained at work. -Counseled on home exercise therapy and supportive care. -Can pursue shockwave therapy.

## 2020-10-05 NOTE — Progress Notes (Signed)
Virtual Visit via Video Note  I connected with Haya Bielak on 10/05/20 at  1:10 PM EDT by a video enabled telemedicine application and verified that I am speaking with the correct person using two identifiers.  Location: Patient: Vehicle Provider: office   I discussed the limitations of evaluation and management by telemedicine and the availability of in person appointments. The patient expressed understanding and agreed to proceed.  History of Present Illness:  Ms. Heroux is a 35 year old female is following up for her left knee MRI following an injury sustained at work.  MRI was demonstrating mild to moderate extensor tendinitis and low-grade patellofemoral chondromalacia.  Observations/Objective:  Gen: NAD, alert, cooperative with exam, well-appearing  Assessment and Plan:  Patellofemoral chondrosis of left knee: Observed on her MRI.  Could be playing of component of her pain. -Counseled on home exercise therapy and supportive care. -Could consider injection.  Quadriceps tendinitis: MRI was demonstrating moderate extensor tendinosis.  Following injury sustained at work. -Counseled on home exercise therapy and supportive care. -Can pursue shockwave therapy.  Follow Up Instructions:    I discussed the assessment and treatment plan with the patient. The patient was provided an opportunity to ask questions and all were answered. The patient agreed with the plan and demonstrated an understanding of the instructions.   The patient was advised to call back or seek an in-person evaluation if the symptoms worsen or if the condition fails to improve as anticipated.    Clare Gandy, MD

## 2020-10-13 ENCOUNTER — Ambulatory Visit: Payer: Self-pay | Admitting: Neurology

## 2020-10-27 ENCOUNTER — Ambulatory Visit (INDEPENDENT_AMBULATORY_CARE_PROVIDER_SITE_OTHER): Payer: Medicaid Other | Admitting: Primary Care

## 2020-11-10 ENCOUNTER — Ambulatory Visit: Payer: Worker's Compensation | Admitting: Family Medicine

## 2020-11-10 NOTE — Progress Notes (Deleted)
  Maria Mccarthy - 35 y.o. female MRN [id number]  Date of birth: 23-Dec-1985  SUBJECTIVE:  Including CC & ROS.  No chief complaint on file.   Maria Mccarthy is a 35 y.o. female that is  ***.  ***   Review of Systems See HPI   HISTORY: Past Medical, Surgical, Social, and Family History Reviewed & Updated per EMR.   Pertinent Historical Findings include:  Past Medical History:  Diagnosis Date   Anemia    COPD (chronic obstructive pulmonary disease) (HCC)     Past Surgical History:  Procedure Laterality Date   NO PAST SURGERIES      Family History  Problem Relation Age of Onset   Mental illness Mother    Diabetes Maternal Grandmother    Hypertension Maternal Grandmother    Diabetes Maternal Grandfather    Diabetes Paternal Grandfather     Social History   Socioeconomic History   Marital status: Single    Spouse name: Not on file   Number of children: Not on file   Years of education: Not on file   Highest education level: Not on file  Occupational History   Not on file  Tobacco Use   Smoking status: Every Day    Packs/day: 0.25    Types: Cigars, Cigarettes   Smokeless tobacco: Never  Vaping Use   Vaping Use: Never used  Substance and Sexual Activity   Alcohol use: Yes    Alcohol/week: 2.0 standard drinks    Types: 2 Shots of liquor per week    Comment: Occ   Drug use: Yes    Types: Marijuana   Sexual activity: Yes    Birth control/protection: None  Other Topics Concern   Not on file  Social History Narrative   Not on file   Social Determinants of Health   Financial Resource Strain: Not on file  Food Insecurity: Not on file  Transportation Needs: Not on file  Physical Activity: Not on file  Stress: Not on file  Social Connections: Not on file  Intimate Partner Violence: Not on file     PHYSICAL EXAM:  VS: There were no vitals taken for this visit. Physical Exam Gen: NAD, alert, cooperative with exam, well-appearing MSK:  ***       ASSESSMENT & PLAN:   No problem-specific Assessment & Plan notes found for this encounter.

## 2020-11-13 ENCOUNTER — Encounter: Payer: Self-pay | Admitting: Family Medicine

## 2020-11-25 ENCOUNTER — Ambulatory Visit: Payer: Medicaid Other | Admitting: Family Medicine

## 2020-11-25 NOTE — Progress Notes (Deleted)
  Maria Mccarthy - 34 y.o. female MRN [id number]  Date of birth: 02/12/1986  SUBJECTIVE:  Including CC & ROS.  No chief complaint on file.   Maria Mccarthy is a 34 y.o. female that is  ***.  ***   Review of Systems See HPI   HISTORY: Past Medical, Surgical, Social, and Family History Reviewed & Updated per EMR.   Pertinent Historical Findings include:  Past Medical History:  Diagnosis Date   Anemia    COPD (chronic obstructive pulmonary disease) (HCC)     Past Surgical History:  Procedure Laterality Date   NO PAST SURGERIES      Family History  Problem Relation Age of Onset   Mental illness Mother    Diabetes Maternal Grandmother    Hypertension Maternal Grandmother    Diabetes Maternal Grandfather    Diabetes Paternal Grandfather     Social History   Socioeconomic History   Marital status: Single    Spouse name: Not on file   Number of children: Not on file   Years of education: Not on file   Highest education level: Not on file  Occupational History   Not on file  Tobacco Use   Smoking status: Every Day    Packs/day: 0.25    Types: Cigars, Cigarettes   Smokeless tobacco: Never  Vaping Use   Vaping Use: Never used  Substance and Sexual Activity   Alcohol use: Yes    Alcohol/week: 2.0 standard drinks    Types: 2 Shots of liquor per week    Comment: Occ   Drug use: Yes    Types: Marijuana   Sexual activity: Yes    Birth control/protection: None  Other Topics Concern   Not on file  Social History Narrative   Not on file   Social Determinants of Health   Financial Resource Strain: Not on file  Food Insecurity: Not on file  Transportation Needs: Not on file  Physical Activity: Not on file  Stress: Not on file  Social Connections: Not on file  Intimate Partner Violence: Not on file     PHYSICAL EXAM:  VS: There were no vitals taken for this visit. Physical Exam Gen: NAD, alert, cooperative with exam, well-appearing MSK:  ***       ASSESSMENT & PLAN:   No problem-specific Assessment & Plan notes found for this encounter.     

## 2020-11-26 ENCOUNTER — Ambulatory Visit: Payer: Medicaid Other

## 2020-11-29 ENCOUNTER — Ambulatory Visit (INDEPENDENT_AMBULATORY_CARE_PROVIDER_SITE_OTHER): Payer: Worker's Compensation | Admitting: Family Medicine

## 2020-11-29 ENCOUNTER — Other Ambulatory Visit: Payer: Self-pay

## 2020-11-29 VITALS — Ht 64.0 in | Wt 210.0 lb

## 2020-11-29 DIAGNOSIS — M76899 Other specified enthesopathies of unspecified lower limb, excluding foot: Secondary | ICD-10-CM | POA: Diagnosis not present

## 2020-11-29 NOTE — Progress Notes (Signed)
  Maria Mccarthy - 35 y.o. female MRN [id number]  Date of birth: September 13, 1985  SUBJECTIVE:  Including CC & ROS.  No chief complaint on file.   Maria Mccarthy is a 35 y.o. female that is presenting with left knee pain following an injury at work.  Previous imaging has demonstrated tendinitis of the quadriceps tendon.  She is still having ongoing pain.   Review of Systems See HPI   HISTORY: Past Medical, Surgical, Social, and Family History Reviewed & Updated per EMR.   Pertinent Historical Findings include:  Past Medical History:  Diagnosis Date   Anemia    COPD (chronic obstructive pulmonary disease) (HCC)     Past Surgical History:  Procedure Laterality Date   NO PAST SURGERIES      Family History  Problem Relation Age of Onset   Mental illness Mother    Diabetes Maternal Grandmother    Hypertension Maternal Grandmother    Diabetes Maternal Grandfather    Diabetes Paternal Grandfather     Social History   Socioeconomic History   Marital status: Single    Spouse name: Not on file   Number of children: Not on file   Years of education: Not on file   Highest education level: Not on file  Occupational History   Not on file  Tobacco Use   Smoking status: Every Day    Packs/day: 0.25    Types: Cigars, Cigarettes   Smokeless tobacco: Never  Vaping Use   Vaping Use: Never used  Substance and Sexual Activity   Alcohol use: Yes    Alcohol/week: 2.0 standard drinks    Types: 2 Shots of liquor per week    Comment: Occ   Drug use: Yes    Types: Marijuana   Sexual activity: Yes    Birth control/protection: None  Other Topics Concern   Not on file  Social History Narrative   Not on file   Social Determinants of Health   Financial Resource Strain: Not on file  Food Insecurity: Not on file  Transportation Needs: Not on file  Physical Activity: Not on file  Stress: Not on file  Social Connections: Not on file  Intimate Partner Violence: Not on file      PHYSICAL EXAM:  VS: Ht 5\' 4"  (1.626 m)   Wt 210 lb (95.3 kg)   BMI 36.05 kg/m  Physical Exam Gen: NAD, alert, cooperative with exam, well-appearing      ASSESSMENT & PLAN:   Quadriceps tendinitis Pain is ongoing following an injury at work.  Imaging has revealed tendinitis of the quadriceps tendon.   -Counseled on home exercise therapy and supportive care. -Pursue physical therapy. -Pursue shockwave therapy. -Could consider injection

## 2020-11-29 NOTE — Assessment & Plan Note (Signed)
Pain is ongoing following an injury at work.  Imaging has revealed tendinitis of the quadriceps tendon.   -Counseled on home exercise therapy and supportive care. -Pursue physical therapy. -Pursue shockwave therapy. -Could consider injection

## 2020-11-29 NOTE — Patient Instructions (Signed)
Good to see you Please try physical therapy  We would like to start shockwave therapy for the knee. It is $60 per session.  Please let me know your decisions about light duty note   Please send me a message in MyChart with any questions or updates.  Please see me back to start shockwave .   --Dr. Jordan Likes

## 2020-12-10 ENCOUNTER — Telehealth: Payer: Self-pay | Admitting: Family Medicine

## 2020-12-10 NOTE — Telephone Encounter (Signed)
Patient called WC adjuster to say we are waiting on their approval to proceed w/ Injection ? ---Per Provider assessment pln no concrete plan for injection only a consideration (Please clarify) w/ Malachi Bonds @ [employer] [phone]    ASSESSMENT & PLAN:    Quadriceps tendinitis Pain is ongoing following an injury at work.  Imaging has revealed tendinitis of the quadriceps tendon.   -Counseled on home exercise therapy and supportive care. -Pursue physical therapy. -Pursue shockwave therapy. -Could consider injection    -----Forwarding message to provider for review.  -glh

## 2020-12-14 ENCOUNTER — Telehealth: Payer: Self-pay | Admitting: Family Medicine

## 2020-12-14 NOTE — Telephone Encounter (Signed)
I spoke with pt- I informed her Dr. Jordan Likes has left a vm with [name]. And I have faxed and emailed 11/29/20 ov note to [employer]. We are still waiting to see if [facility] will authorize pt to start shockwave therapy here in our office.

## 2020-12-14 NOTE — Telephone Encounter (Signed)
Pt left msg on office VMB over weekend asking Provider to advise which should be done 1st  Shock wave  or Physical Therapy.  --Forwarding msg to Dr. Jordan Likes to call pt @ [phone]

## 2020-12-14 NOTE — Telephone Encounter (Signed)
[name] frm WC carrier [employer] [phone] called, needed clarification of pt's continued WC treatments says rcvd info from patient (Re: shock wave therapy & Physical therapy) and ask that we give clearer definition of Dr's orders for both. Advise her it doesn't matter which pt does 1st.  --Advised her that our CMA emailed her updated doctor orders (she found them) but still wasn't sure if we did the ESW (told her it's done here in our office . Advised that the fee is $60 per session for individual body parts (needs to be paid up front).  --Advised her the PT is something they can set up  with a preferred  provider or that we can schedule with their permission.   ---Maria Mccarthy approved ESW trmt & is sending a pmt for $240 for (4) sessions  payable to [facility] ,they ask to be refunded if patient does NOT complete all sessions.  Forwarding update to provider  as Lorain Childes  -glh

## 2020-12-14 NOTE — Telephone Encounter (Signed)
Left VM for Worker's Comp agent. If she calls back please have her speak with a nurse/CMA and inform that I would pursue shockwave therapy for the patient. .   If any questions then please take the best time and phone number to call and I will try to call her back.   Myra Rude, MD [facility] 12/14/2020, 11:14 AM

## 2020-12-27 ENCOUNTER — Telehealth: Payer: Self-pay | Admitting: Family Medicine

## 2020-12-27 NOTE — Telephone Encounter (Signed)
Brooke Clinical research associate @ [employer]  workers comp carrier called to report Shock Wave treatment not approved for pt.  WC adjuster request provider contact her with alternative trmt & to confirm pt is still in physical therapy.  Brooke's ph# [phone].  --glh

## 2020-12-28 NOTE — Telephone Encounter (Signed)
Left VM for patient. If the worker's comp rep calls back please have her speak with a nurse/CMA and inform that she will have to contact the patient for updates in regards to physical therapy.  We can pursue nitro patches for ongoing pain.   If any questions then please take the best time and phone number to call and I will try to call her back.   Myra Rude, MD [facility] 12/28/2020, 5:32 PM

## 2021-01-04 ENCOUNTER — Encounter: Payer: Self-pay | Admitting: Neurology

## 2021-01-04 ENCOUNTER — Ambulatory Visit: Payer: Self-pay | Admitting: Neurology

## 2021-01-05 ENCOUNTER — Telehealth: Payer: Self-pay | Admitting: Family Medicine

## 2021-01-05 NOTE — Telephone Encounter (Signed)
Called pt to schedule 6-8 week f/u--no answer--msg left.  --glh

## 2021-01-18 ENCOUNTER — Telehealth: Payer: Self-pay | Admitting: Family Medicine

## 2021-01-18 NOTE — Telephone Encounter (Signed)
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  --Per patient she also has to climb Up & Down to get in and out of truck with is causing her pain & discomfort----Patient is requesting provider review for restrictions.  --She has also scheduled appt for  01/20/21   --glh

## 2021-01-20 ENCOUNTER — Ambulatory Visit: Payer: Medicaid Other | Admitting: Family Medicine

## 2021-01-20 NOTE — Progress Notes (Deleted)
  Maria Mccarthy - 34 y.o. female MRN [id number]  Date of birth: 12/06/1985  SUBJECTIVE:  Including CC & ROS.  No chief complaint on file.   Maria Mccarthy is a 34 y.o. female that is  ***.  ***   Review of Systems See HPI   HISTORY: Past Medical, Surgical, Social, and Family History Reviewed & Updated per EMR.   Pertinent Historical Findings include:  Past Medical History:  Diagnosis Date   Anemia    COPD (chronic obstructive pulmonary disease) (HCC)     Past Surgical History:  Procedure Laterality Date   NO PAST SURGERIES      Family History  Problem Relation Age of Onset   Mental illness Mother    Diabetes Maternal Grandmother    Hypertension Maternal Grandmother    Diabetes Maternal Grandfather    Diabetes Paternal Grandfather     Social History   Socioeconomic History   Marital status: Single    Spouse name: Not on file   Number of children: Not on file   Years of education: Not on file   Highest education level: Not on file  Occupational History   Not on file  Tobacco Use   Smoking status: Every Day    Packs/day: 0.25    Types: Cigars, Cigarettes   Smokeless tobacco: Never  Vaping Use   Vaping Use: Never used  Substance and Sexual Activity   Alcohol use: Yes    Alcohol/week: 2.0 standard drinks    Types: 2 Shots of liquor per week    Comment: Occ   Drug use: Yes    Types: Marijuana   Sexual activity: Yes    Birth control/protection: None  Other Topics Concern   Not on file  Social History Narrative   Not on file   Social Determinants of Health   Financial Resource Strain: Not on file  Food Insecurity: Not on file  Transportation Needs: Not on file  Physical Activity: Not on file  Stress: Not on file  Social Connections: Not on file  Intimate Partner Violence: Not on file     PHYSICAL EXAM:  VS: There were no vitals taken for this visit. Physical Exam Gen: NAD, alert, cooperative with exam, well-appearing MSK:  ***       ASSESSMENT & PLAN:   No problem-specific Assessment & Plan notes found for this encounter.     

## 2021-01-23 ENCOUNTER — Encounter (HOSPITAL_BASED_OUTPATIENT_CLINIC_OR_DEPARTMENT_OTHER): Payer: Self-pay | Admitting: Emergency Medicine

## 2021-01-23 ENCOUNTER — Other Ambulatory Visit: Payer: Self-pay

## 2021-01-23 ENCOUNTER — Emergency Department (HOSPITAL_BASED_OUTPATIENT_CLINIC_OR_DEPARTMENT_OTHER): Payer: Medicaid Other

## 2021-01-23 ENCOUNTER — Emergency Department (HOSPITAL_BASED_OUTPATIENT_CLINIC_OR_DEPARTMENT_OTHER)
Admission: EM | Admit: 2021-01-23 | Discharge: 2021-01-23 | Disposition: A | Discharge status: Home / Self Care | Payer: Medicaid Other | Attending: Emergency Medicine | Admitting: Emergency Medicine

## 2021-01-23 DIAGNOSIS — T5991XA Toxic effect of unspecified gases, fumes and vapors, accidental (unintentional), initial encounter: Secondary | ICD-10-CM | POA: Insufficient documentation

## 2021-01-23 DIAGNOSIS — J441 Chronic obstructive pulmonary disease with (acute) exacerbation: Secondary | ICD-10-CM | POA: Insufficient documentation

## 2021-01-23 DIAGNOSIS — F17210 Nicotine dependence, cigarettes, uncomplicated: Secondary | ICD-10-CM | POA: Insufficient documentation

## 2021-01-23 MED ORDER — DEXAMETHASONE SODIUM PHOSPHATE 10 MG/ML IJ SOLN
10.0000 mg | Freq: Once | INTRAMUSCULAR | Status: AC
  Administered 2021-01-23: 10 mg via INTRAMUSCULAR
  Filled 2021-01-23: qty 1

## 2021-01-23 MED ORDER — ONDANSETRON 4 MG PO TBDP
8.0000 mg | ORAL_TABLET | Freq: Once | ORAL | Status: AC
  Administered 2021-01-23: 8 mg via ORAL
  Filled 2021-01-23: qty 2

## 2021-01-23 MED ORDER — BUDESONIDE-FORMOTEROL FUMARATE 160-4.5 MCG/ACT IN AERO: 2.0000 | INHALATION_SPRAY | Freq: Two times a day (BID) | RESPIRATORY_TRACT | 12 refills | Status: AC

## 2021-01-23 MED ORDER — IPRATROPIUM BROMIDE 0.02 % IN SOLN
0.5000 mg | Freq: Once | RESPIRATORY_TRACT | Status: AC
  Administered 2021-01-23: 0.5 mg via RESPIRATORY_TRACT
  Filled 2021-01-23: qty 2.5

## 2021-01-23 MED ORDER — ALBUTEROL SULFATE (2.5 MG/3ML) 0.083% IN NEBU
10.0000 mg | INHALATION_SOLUTION | Freq: Once | RESPIRATORY_TRACT | Status: AC
  Administered 2021-01-23: 10 mg via RESPIRATORY_TRACT
  Filled 2021-01-23: qty 12

## 2021-01-23 MED ORDER — IPRATROPIUM-ALBUTEROL 0.5-2.5 (3) MG/3ML IN SOLN: 3.0000 mL | RESPIRATORY_TRACT | Status: DC

## 2021-01-23 MED ORDER — ACETAMINOPHEN 500 MG PO TABS
1000.0000 mg | ORAL_TABLET | Freq: Once | ORAL | Status: AC
  Administered 2021-01-23: 1000 mg via ORAL
  Filled 2021-01-23: qty 2

## 2021-01-23 MED ORDER — ALBUTEROL SULFATE HFA 108 (90 BASE) MCG/ACT IN AERS
2.0000 | INHALATION_SPRAY | RESPIRATORY_TRACT | Status: DC | PRN
  Administered 2021-01-23: 2 via RESPIRATORY_TRACT
  Filled 2021-01-23: qty 6.7
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
## 2021-01-23 NOTE — ED Notes (Signed)
Patient verbalizes understanding of discharge instructions. Opportunity for questioning and answers were provided. Armband removed by staff, pt discharged from ED. Ambulated out to lobby  

## 2021-01-23 NOTE — ED Notes (Signed)
Patient transported to X-ray 

## 2021-05-12 ENCOUNTER — Encounter: Payer: Self-pay | Admitting: Obstetrics & Gynecology

## 2021-10-07 ENCOUNTER — Encounter: Payer: Self-pay | Admitting: Family Medicine

## 2022-05-20 IMAGING — CR DG KNEE 1-2V*L*
2 series · 2 of 2 positions shown · non-contrast
Comparison: None.

CLINICAL DATA: Lt lat knee pain x4days. Pt is a truck driver and
uses lt leg for clutch.

EXAM:
LEFT KNEE - 1-2 VIEW

[w knee ap left *]
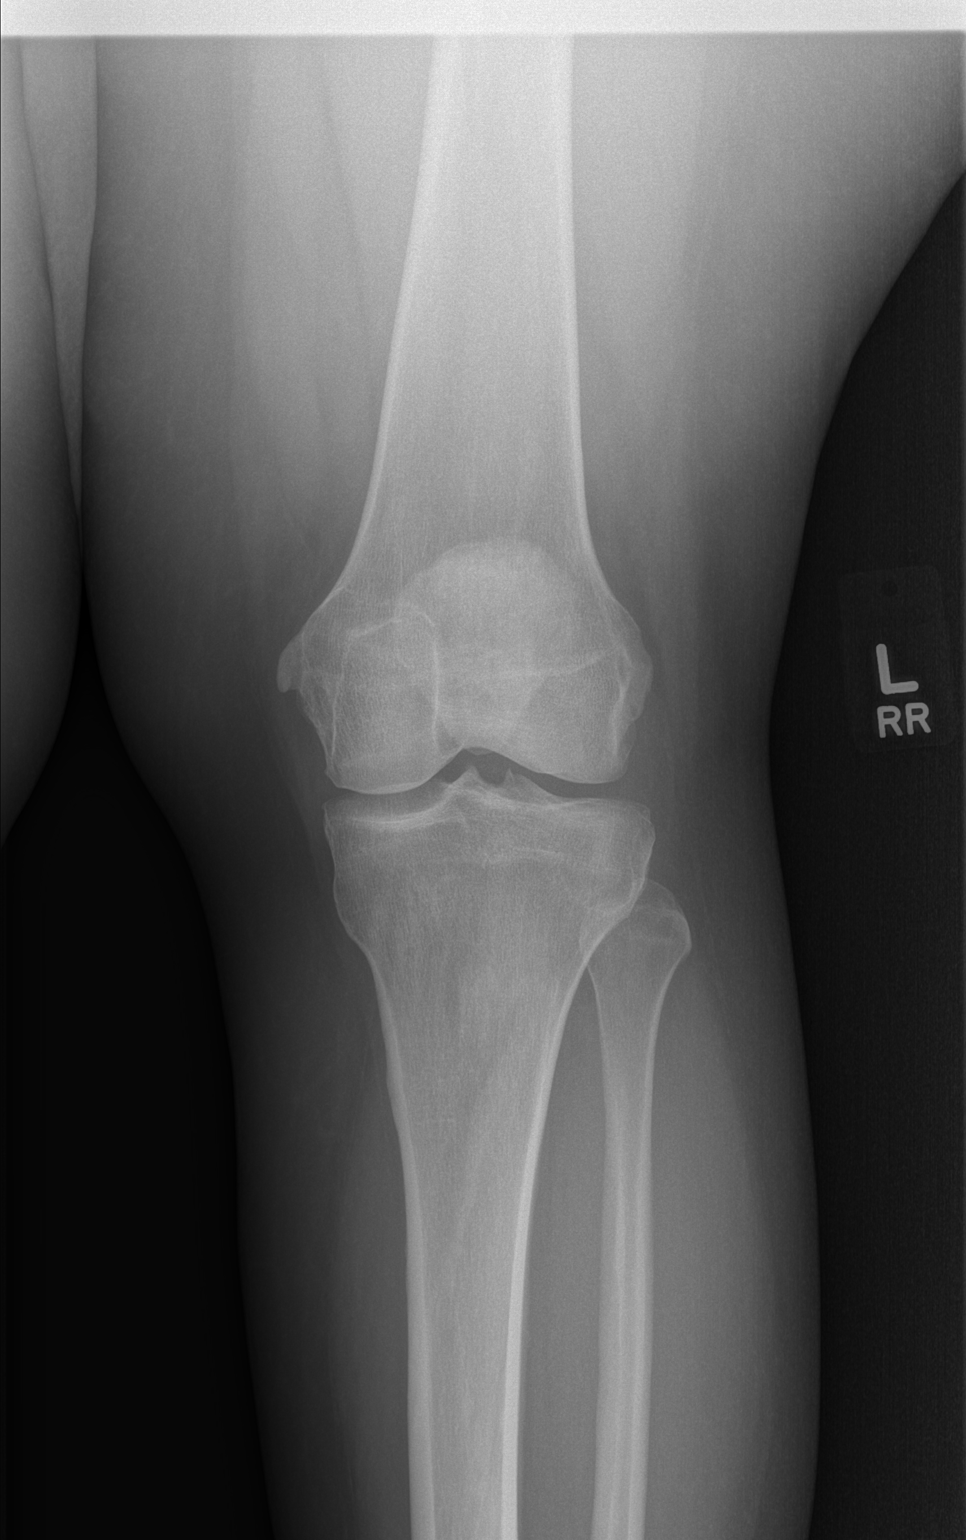

[w knee lat. left *]
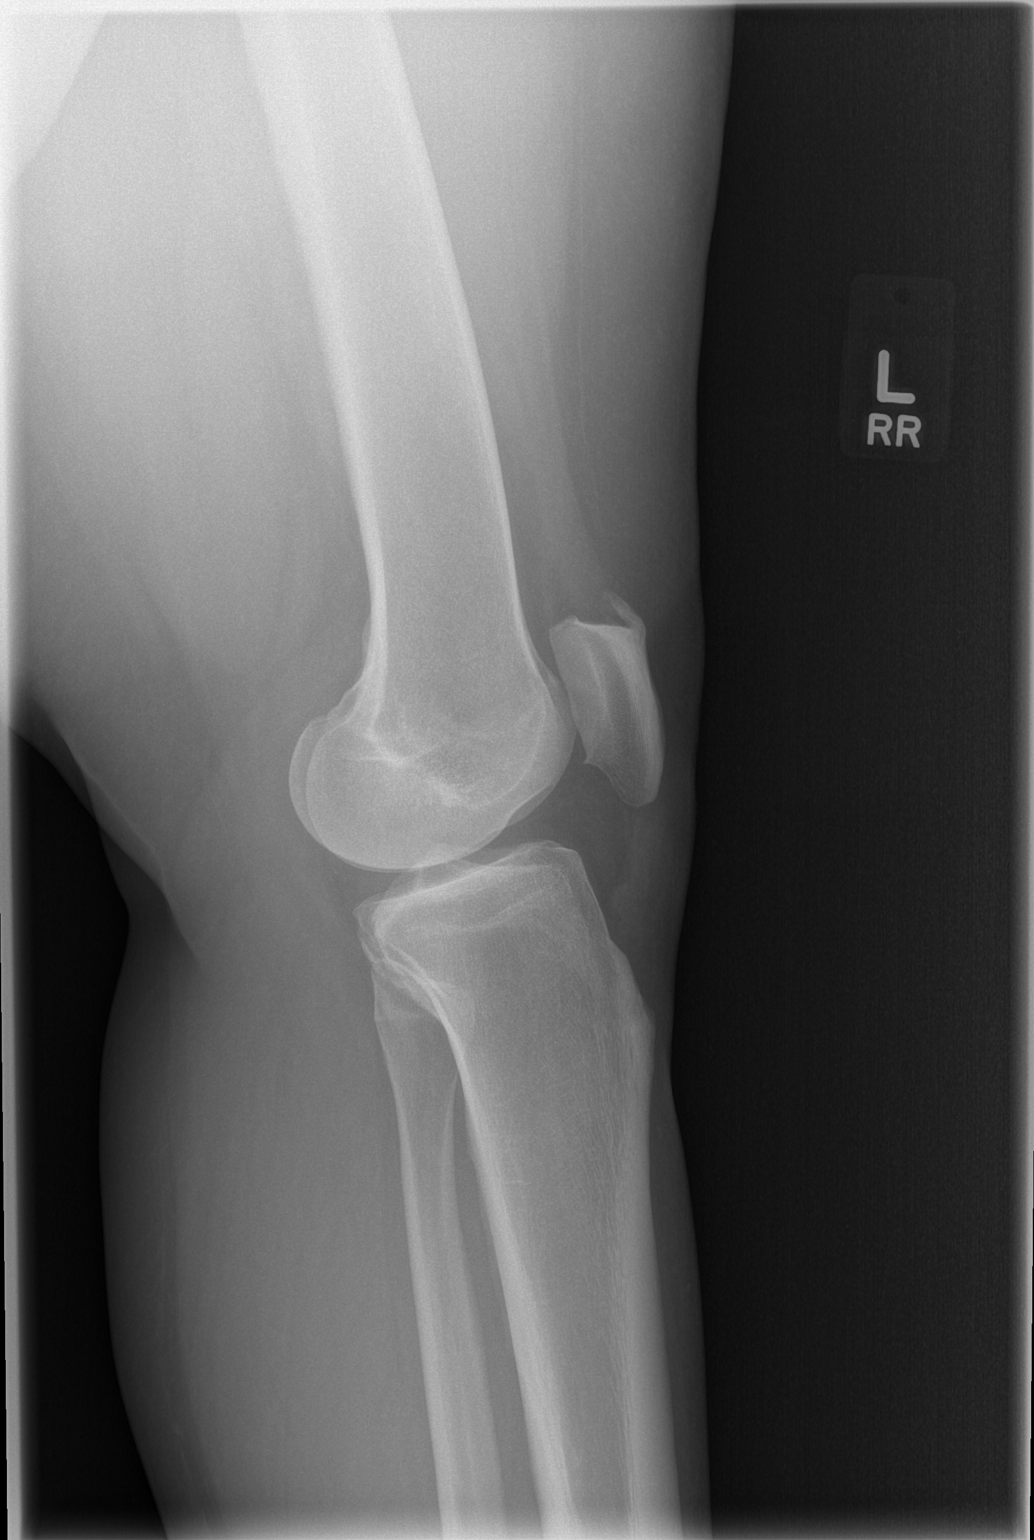

[2 of 2 positions shown; findings below may reference images not displayed]

FINDINGS: No evidence of fracture, dislocation, or joint effusion. Very mild
tricompartmental degenerative changes. No focal bone lesion.
Regional soft tissues are unremarkable.
IMPRESSION: No acute finding in the left knee.  Very mild degenerative changes.

## 2022-07-24 ENCOUNTER — Encounter: Payer: Self-pay | Admitting: *Deleted

## 2023-01-05 ENCOUNTER — Emergency Department (HOSPITAL_BASED_OUTPATIENT_CLINIC_OR_DEPARTMENT_OTHER)
Admission: EM | Admit: 2023-01-05 | Discharge: 2023-01-05 | Disposition: A | Discharge status: Home / Self Care | Payer: No Typology Code available for payment source | Attending: Emergency Medicine | Admitting: Emergency Medicine

## 2023-01-05 ENCOUNTER — Emergency Department (HOSPITAL_BASED_OUTPATIENT_CLINIC_OR_DEPARTMENT_OTHER): Payer: No Typology Code available for payment source

## 2023-01-05 ENCOUNTER — Encounter (HOSPITAL_BASED_OUTPATIENT_CLINIC_OR_DEPARTMENT_OTHER): Payer: Self-pay | Admitting: Emergency Medicine

## 2023-01-05 DIAGNOSIS — F17210 Nicotine dependence, cigarettes, uncomplicated: Secondary | ICD-10-CM | POA: Insufficient documentation

## 2023-01-05 DIAGNOSIS — D259 Leiomyoma of uterus, unspecified: Secondary | ICD-10-CM | POA: Diagnosis not present

## 2023-01-05 DIAGNOSIS — D219 Benign neoplasm of connective and other soft tissue, unspecified: Secondary | ICD-10-CM

## 2023-01-05 DIAGNOSIS — R1012 Left upper quadrant pain: Secondary | ICD-10-CM | POA: Insufficient documentation

## 2023-01-05 DIAGNOSIS — R202 Paresthesia of skin: Secondary | ICD-10-CM | POA: Diagnosis present

## 2023-01-05 DIAGNOSIS — W19XXXA Unspecified fall, initial encounter: Secondary | ICD-10-CM

## 2023-01-05 DIAGNOSIS — R1013 Epigastric pain: Secondary | ICD-10-CM | POA: Diagnosis not present

## 2023-01-05 DIAGNOSIS — S93401A Sprain of unspecified ligament of right ankle, initial encounter: Secondary | ICD-10-CM

## 2023-01-05 DIAGNOSIS — M545 Low back pain, unspecified: Secondary | ICD-10-CM | POA: Diagnosis not present

## 2023-01-05 DIAGNOSIS — W1830XA Fall on same level, unspecified, initial encounter: Secondary | ICD-10-CM | POA: Diagnosis not present

## 2023-01-05 DIAGNOSIS — J449 Chronic obstructive pulmonary disease, unspecified: Secondary | ICD-10-CM | POA: Diagnosis not present

## 2023-01-05 DIAGNOSIS — S8391XA Sprain of unspecified site of right knee, initial encounter: Secondary | ICD-10-CM

## 2023-01-05 LAB — CBC WITH DIFFERENTIAL/PLATELET
Abs Immature Granulocytes: 0.03 10*3/uL (ref 0.00–0.07)
Basophils Absolute: 0 10*3/uL (ref 0.0–0.1)
Basophils Relative: 0 %
Eosinophils Absolute: 0.2 10*3/uL (ref 0.0–0.5)
Eosinophils Relative: 2 %
HCT: 35.4 % — ABNORMAL LOW (ref 36.0–46.0)
Hemoglobin: 11.4 g/dL — ABNORMAL LOW (ref 12.0–15.0)
Immature Granulocytes: 0 %
Lymphocytes Relative: 31 %
Lymphs Abs: 3 10*3/uL (ref 0.7–4.0)
MCH: 25.2 pg — ABNORMAL LOW (ref 26.0–34.0)
MCHC: 32.2 g/dL (ref 30.0–36.0)
MCV: 78.3 fL — ABNORMAL LOW (ref 80.0–100.0)
Monocytes Absolute: 0.6 10*3/uL (ref 0.1–1.0)
Monocytes Relative: 6 %
Neutro Abs: 5.8 10*3/uL (ref 1.7–7.7)
Neutrophils Relative %: 61 %
Platelets: 354 10*3/uL (ref 150–400)
RBC: 4.52 MIL/uL (ref 3.87–5.11)
RDW: 16.2 % — ABNORMAL HIGH (ref 11.5–15.5)
WBC: 9.7 10*3/uL (ref 4.0–10.5)
nRBC: 0 % (ref 0.0–0.2)

## 2023-01-05 LAB — HCG, SERUM, QUALITATIVE: Preg, Serum: NEGATIVE

## 2023-01-05 LAB — COMPREHENSIVE METABOLIC PANEL
ALT: 14 U/L (ref 0–44)
AST: 21 U/L (ref 15–41)
Albumin: 3.3 g/dL — ABNORMAL LOW (ref 3.5–5.0)
Alkaline Phosphatase: 39 U/L (ref 38–126)
Anion gap: 11 (ref 5–15)
BUN: 8 mg/dL (ref 6–20)
CO2: 21 mmol/L — ABNORMAL LOW (ref 22–32)
Calcium: 8.3 mg/dL — ABNORMAL LOW (ref 8.9–10.3)
Chloride: 106 mmol/L (ref 98–111)
Creatinine, Ser: 0.7 mg/dL (ref 0.44–1.00)
GFR, Estimated: >60 mL/min (ref >60)
Glucose, Bld: 114 mg/dL — ABNORMAL HIGH (ref 70–99)
Potassium: 3.6 mmol/L (ref 3.5–5.1)
Sodium: 138 mmol/L (ref 135–145)
Total Bilirubin: 0.9 mg/dL (ref 0.3–1.2)
Total Protein: 6.9 g/dL (ref 6.5–8.1)

## 2023-01-05 LAB — LIPASE, BLOOD: Lipase: 26 U/L (ref 11–51)

## 2023-01-05 MED ORDER — IBUPROFEN 800 MG PO TABS: 800.0000 mg | ORAL_TABLET | Freq: Three times a day (TID) | ORAL | 0 refills | Status: AC | PRN

## 2023-01-05 MED ORDER — ACETAMINOPHEN-CODEINE 300-30 MG PO TABS: 1.0000 | ORAL_TABLET | Freq: Four times a day (QID) | ORAL | 0 refills | Status: AC | PRN

## 2023-01-05 MED ORDER — IOHEXOL 300 MG/ML  SOLN
100.0000 mL | Freq: Once | INTRAMUSCULAR | Status: AC | PRN
  Administered 2023-01-05: 100 mL via INTRAVENOUS

## 2023-01-05 MED ORDER — KETOROLAC TROMETHAMINE 15 MG/ML IJ SOLN
15.0000 mg | Freq: Once | INTRAMUSCULAR | Status: AC
  Administered 2023-01-05: 15 mg via INTRAVENOUS
  Filled 2023-01-05: qty 1
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
## 2023-01-05 NOTE — ED Notes (Signed)
Patient transported to CT 
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
## 2023-01-05 NOTE — ED Notes (Signed)
IV Removed  
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
## 2023-01-05 NOTE — Discharge Instructions (Addendum)
It was a pleasure caring for you today in the emergency department.  Your imaging today was reassuring.  You likely have a sprain to your knee, potentially ankle as well.  Please follow sports medicine if you are still symptomatic after 2 weeks  Please return to the emergency department for any worsening or worrisome symptoms.

## 2023-01-16 ENCOUNTER — Ambulatory Visit: Payer: No Typology Code available for payment source | Admitting: Sports Medicine

## 2023-05-06 ENCOUNTER — Other Ambulatory Visit: Payer: Self-pay

## 2023-05-06 ENCOUNTER — Emergency Department (HOSPITAL_BASED_OUTPATIENT_CLINIC_OR_DEPARTMENT_OTHER)
Admission: EM | Admit: 2023-05-06 | Discharge: 2023-05-06 | Disposition: A | Discharge status: Home / Self Care | Payer: No Typology Code available for payment source | Attending: Emergency Medicine | Admitting: Emergency Medicine

## 2023-05-06 ENCOUNTER — Encounter (HOSPITAL_BASED_OUTPATIENT_CLINIC_OR_DEPARTMENT_OTHER): Payer: Self-pay

## 2023-05-06 DIAGNOSIS — Z20822 Contact with and (suspected) exposure to covid-19: Secondary | ICD-10-CM | POA: Diagnosis not present

## 2023-05-06 DIAGNOSIS — J101 Influenza due to other identified influenza virus with other respiratory manifestations: Secondary | ICD-10-CM | POA: Insufficient documentation

## 2023-05-06 DIAGNOSIS — Z91040 Latex allergy status: Secondary | ICD-10-CM | POA: Diagnosis not present

## 2023-05-06 DIAGNOSIS — R059 Cough, unspecified: Secondary | ICD-10-CM | POA: Diagnosis present

## 2023-05-06 LAB — RESP PANEL BY RT-PCR (RSV, FLU A&B, COVID)  RVPGX2
Influenza A by PCR: POSITIVE — AB
Influenza B by PCR: NEGATIVE
Resp Syncytial Virus by PCR: NEGATIVE
SARS Coronavirus 2 by RT PCR: NEGATIVE

## 2023-05-06 MED ORDER — BENZONATATE 100 MG PO CAPS
100.0000 mg | ORAL_CAPSULE | Freq: Three times a day (TID) | ORAL | 0 refills | Status: AC
Start: 2023-05-06 — End: ?

## 2023-05-06 MED ORDER — IBUPROFEN 400 MG PO TABS
600.0000 mg | ORAL_TABLET | Freq: Once | ORAL | Status: AC
  Administered 2023-05-06: 600 mg via ORAL
  Filled 2023-05-06: qty 1

## 2023-05-06 MED ORDER — OSELTAMIVIR PHOSPHATE 75 MG PO CAPS
75.0000 mg | ORAL_CAPSULE | Freq: Two times a day (BID) | ORAL | 0 refills | Status: AC
Start: 2023-05-06 — End: ?

## 2023-05-06 NOTE — ED Triage Notes (Signed)
Reports possible URI; chest pain, SHOB w exertion, fatigue, cough, fevers since yesterday  States her husband was sick recently

## 2023-05-06 NOTE — Discharge Instructions (Addendum)
You tested positive for the flu today.  Your COVID, RSV are negative.  Medications prescribed:   You have been prescribed Tamiflu to help decrease the duration of your symptoms.  This is an antiviral medication for the flu.  Please take this as prescribed.  You have been prescribed benzonatate to use as needed for cough.  Home care instructions:  You may take up to 1000mg  of tylenol (acetaminophen) every 6 hours as needed for pain.  Do not take more then 4g per day.  If you decide to take Tylenol, please be cautious as many other over-the-counter medications cold and flu medications, as well as Excedrin, contain Tylenol.  Make sure to not go over 1000 mg of tylenol in total every 6 hours.  You may use up to 600mg  ibuprofen every 6 hours as needed for pain.  Do not exceed 2.4g of ibuprofen per day.  You were given your first dose here today.  Your next dose can be no sooner than 11 PM tonight.   For cough: honey 1/2 to 1 teaspoon (you can dilute the honey in water or another fluid).  You can also use guaifenesin and dextromethorphan for cough which are over-the-counter medications. You can use a humidifier for chest congestion and cough.  If you don't have a humidifier, you can sit in the bathroom with the hot shower running.      For sore throat: try warm salt water gargles, cepacol lozenges, throat spray, warm tea or water with lemon/honey, popsicles or ice, or OTC cold relief medicine for throat discomfort.    For congestion: Flonase 1-2 sprays in each nostril daily.    It is important to stay hydrated: drink plenty of fluids (water, gatorade/powerade/pedialyte, juices, or teas) to keep your throat moisturized and help further relieve irritation/discomfort.   Your illness is contagious and can be spread to others, especially during the first 3 or 4 days. It cannot be cured by antibiotics or other medicines. Take basic precautions such as wearing a mask, washing your hands often, covering your  mouth when you cough or sneeze, and avoiding public places where you could spread your illness to others.   You may return to normal activities (work/school) when:  - You are having improvement in symptoms  - AND have had resolution of fever without the use of fever-reducing medications for 24 hours  Follow-up instructions: Please follow-up with your primary care provider for further evaluation of your symptoms if you are not feeling better within the next 5 days.  Return instructions:  Please return to the Emergency Department if you experience worsening symptoms.  RETURN IMMEDIATELY IF you develop shortness of breath, confusion or altered mental status, a new rash, become dizzy, faint, or poorly responsive, or are unable to be cared for at home. Please return if you have persistent vomiting and cannot keep down fluids or develop a fever that is not controlled by tylenol or motrin.   Please return if you have any other emergent concerns.

## 2023-05-06 NOTE — ED Provider Notes (Signed)
[place] [facility] AT [facility] Provider Note   CSN: [id number] Arrival date & time: 05/06/23  1459     History  Chief Complaint  Patient presents with   URI    Maria Mccarthy is a 38 y.o. female with obesity presents with concern for cough and shortness of breath, fatigue, headache, and fever since yesterday.  The cough is keeping her up at night.  States her significant other is sick, and she feels like she caught something from him.  Denies nausea, vomiting, abdominal pain, but has had decreased appetite.  Has been able to drink ginger ale without difficulty.   URI Presenting symptoms: fever        Home Medications Prior to Admission medications   Medication Sig Start Date End Date Taking? Authorizing Provider  benzonatate (TESSALON) 100 MG capsule Take 1 capsule (100 mg total) by mouth every 8 (eight) hours. 05/06/23  Yes Arabella Merles, PA-C  oseltamivir (TAMIFLU) 75 MG capsule Take 1 capsule (75 mg total) by mouth every 12 (twelve) hours. 05/06/23  Yes Arabella Merles, PA-C  acetaminophen-codeine (TYLENOL #3) 300-30 MG tablet Take 1-2 tablets by mouth every 6 (six) hours as needed for moderate pain. 01/05/23   Sloan Leiter, DO  budesonide-formoterol (SYMBICORT) 160-4.5 MCG/ACT inhaler Inhale 2 puffs into the lungs in the morning and at bedtime. 01/23/21   Molpus, John, MD  ibuprofen (ADVIL) 800 MG tablet Take 1 tablet (800 mg total) by mouth every 8 (eight) hours as needed for mild pain or moderate pain. 01/05/23   Sloan Leiter, DO      Allergies    Latex    Review of Systems   Review of Systems  Constitutional:  Positive for fever.    Physical Exam Updated Vital Signs BP (!) 132/92   Pulse 88   Temp 99.4 F (37.4 C) (Oral)   Resp 18   Ht 5\' 2"  (1.575 m)   Wt 127 kg   LMP 05/06/2023 (Exact Date)   SpO2 99%   BMI 51.21 kg/m  Physical Exam Vitals and nursing note reviewed.  Constitutional:      General: She is not in acute  distress.    Appearance: She is well-developed.  HENT:     Head: Normocephalic and atraumatic.     Nose: Congestion present.  Eyes:     Conjunctiva/sclera: Conjunctivae normal.  Cardiovascular:     Rate and Rhythm: Normal rate and regular rhythm.     Heart sounds: No murmur heard. Pulmonary:     Effort: Pulmonary effort is normal. No respiratory distress.     Breath sounds: Normal breath sounds.     Comments: Lungs sound clear to auscultation, although difficult to appreciate lung sounds due to patient's body habitus  Patient breathing comfortably on room air, talking in full sentences Abdominal:     Palpations: Abdomen is soft.     Tenderness: There is no abdominal tenderness.  Musculoskeletal:        General: No swelling.     Cervical back: Neck supple.  Skin:    General: Skin is warm and dry.     Capillary Refill: Capillary refill takes less than 2 seconds.  Neurological:     Mental Status: She is alert.  Psychiatric:        Mood and Affect: Mood normal.     ED Results / Procedures / Treatments   Labs (all labs ordered are listed, but only abnormal results are displayed) Labs Reviewed  RESP PANEL BY RT-PCR (RSV, FLU A&B, COVID)  RVPGX2 - Abnormal; Notable for the following components:      Result Value   Influenza A by PCR POSITIVE (*)    All other components within normal limits    EKG None  Radiology No results found.  Procedures Procedures    Medications Ordered in ED Medications  ibuprofen (ADVIL) tablet 600 mg (has no administration in time range)    ED Course/ Medical Decision Making/ A&P                                 Medical Decision Making    Differential diagnosis includes but is not limited to COVID, flu, RSV, viral URI, strep pharyngitis, viral pharyngitis, allergic rhinitis, pneumonia, bronchitis   ED Course:  Patient overall well-appearing, stable vital signs aside from a elevated blood pressure 130/94.  She reports some  shortness of breath, but breathing comfortably on room air, 97% on room air.  Talking in full sentences.  Lungs sound clear to auscultation, although difficult to appreciate lung sounds due to patient body habitus.  Given symptoms just started yesterday, low concern for pneumonia.  Patient tested positive for influenza A which explains her symptoms.  RSV and COVID-negative. Patient given ibuprofen for headache.  States she take Tylenol earlier  Patient stable and appropriate for discharge home.  Given increased body habitus and her shortness of breath, and symptoms started within the last 24 hours, feel she is appropriate candidate for Tamiflu.  This is been prescribed for patient along with benzonatate for cough.  Impression: Influenza A  Disposition:  The patient was discharged home with instructions to take Tamiflu as prescribed.  Over-the-counter medications as needed for symptom control.  May use benzonatate sparingly as needed for cough.  Instructed patient to try and allow herself to cough as this is beneficial for the body.  Follow-up with PCP if symptoms not improving within the next 5 days.  Patient instructed to remain out of work for 5 days, at least until symptoms improve. Work note provided Return precautions given.              Final Clinical Impression(s) / ED Diagnoses Final diagnoses:  Influenza A    Rx / DC Orders ED Discharge Orders          Ordered    benzonatate (TESSALON) 100 MG capsule  Every 8 hours        05/06/23 1706    oseltamivir (TAMIFLU) 75 MG capsule  Every 12 hours        05/06/23 1706              Arabella Merles, PA-C 05/06/23 1715    Pricilla Loveless, MD 05/07/23 1435

## 2023-05-08 ENCOUNTER — Encounter (HOSPITAL_BASED_OUTPATIENT_CLINIC_OR_DEPARTMENT_OTHER): Payer: Self-pay | Admitting: Emergency Medicine

## 2023-05-08 ENCOUNTER — Other Ambulatory Visit: Payer: Self-pay

## 2023-05-08 ENCOUNTER — Emergency Department (HOSPITAL_BASED_OUTPATIENT_CLINIC_OR_DEPARTMENT_OTHER)
Admission: EM | Admit: 2023-05-08 | Discharge: 2023-05-08 | Disposition: A | Discharge status: Home / Self Care | Payer: No Typology Code available for payment source | Attending: Emergency Medicine | Admitting: Emergency Medicine

## 2023-05-08 ENCOUNTER — Other Ambulatory Visit (HOSPITAL_BASED_OUTPATIENT_CLINIC_OR_DEPARTMENT_OTHER): Payer: Self-pay

## 2023-05-08 ENCOUNTER — Emergency Department (HOSPITAL_BASED_OUTPATIENT_CLINIC_OR_DEPARTMENT_OTHER): Payer: No Typology Code available for payment source

## 2023-05-08 DIAGNOSIS — J449 Chronic obstructive pulmonary disease, unspecified: Secondary | ICD-10-CM | POA: Insufficient documentation

## 2023-05-08 DIAGNOSIS — Z91040 Latex allergy status: Secondary | ICD-10-CM | POA: Insufficient documentation

## 2023-05-08 DIAGNOSIS — J4521 Mild intermittent asthma with (acute) exacerbation: Secondary | ICD-10-CM | POA: Insufficient documentation

## 2023-05-08 DIAGNOSIS — R509 Fever, unspecified: Secondary | ICD-10-CM | POA: Diagnosis present

## 2023-05-08 DIAGNOSIS — Z7951 Long term (current) use of inhaled steroids: Secondary | ICD-10-CM | POA: Diagnosis not present

## 2023-05-08 DIAGNOSIS — Z7952 Long term (current) use of systemic steroids: Secondary | ICD-10-CM | POA: Insufficient documentation

## 2023-05-08 DIAGNOSIS — J101 Influenza due to other identified influenza virus with other respiratory manifestations: Secondary | ICD-10-CM | POA: Diagnosis not present

## 2023-05-08 MED ORDER — ALBUTEROL SULFATE HFA 108 (90 BASE) MCG/ACT IN AERS
1.0000 | INHALATION_SPRAY | RESPIRATORY_TRACT | Status: DC | PRN
  Administered 2023-05-08: 1 via RESPIRATORY_TRACT
  Filled 2023-05-08: qty 6.7

## 2023-05-08 MED ORDER — AEROCHAMBER Z-STAT PLUS/MEDIUM MISC
1.0000 | Freq: Once | Status: AC
  Administered 2023-05-08: 1
  Filled 2023-05-08: qty 1

## 2023-05-08 MED ORDER — HYDROCOD POLI-CHLORPHE POLI ER 10-8 MG/5ML PO SUER
5.0000 mL | Freq: Two times a day (BID) | ORAL | 0 refills | Status: AC | PRN
  Filled 2023-05-08: qty 115, 12d supply, fill #0

## 2023-05-08 MED ORDER — IPRATROPIUM-ALBUTEROL 0.5-2.5 (3) MG/3ML IN SOLN
3.0000 mL | Freq: Once | RESPIRATORY_TRACT | Status: AC
  Administered 2023-05-08: 3 mL via RESPIRATORY_TRACT
  Filled 2023-05-08: qty 3

## 2023-05-08 MED ORDER — ALBUTEROL SULFATE (2.5 MG/3ML) 0.083% IN NEBU
2.5000 mg | INHALATION_SOLUTION | Freq: Once | RESPIRATORY_TRACT | Status: AC
  Administered 2023-05-08: 2.5 mg via RESPIRATORY_TRACT
  Filled 2023-05-08: qty 3

## 2023-05-08 MED ORDER — DEXAMETHASONE SODIUM PHOSPHATE 10 MG/ML IJ SOLN
10.0000 mg | Freq: Once | INTRAMUSCULAR | Status: AC
  Administered 2023-05-08: 10 mg via INTRAMUSCULAR
  Filled 2023-05-08: qty 1

## 2023-05-08 MED ORDER — HYDROCOD POLI-CHLORPHE POLI ER 10-8 MG/5ML PO SUER
5.0000 mL | Freq: Once | ORAL | Status: AC
  Administered 2023-05-08: 5 mL via ORAL
  Filled 2023-05-08: qty 5

## 2023-05-08 MED ORDER — PREDNISONE 50 MG PO TABS
50.0000 mg | ORAL_TABLET | Freq: Every day | ORAL | 0 refills | Status: AC
  Filled 2023-05-08: qty 5, 5d supply, fill #0

## 2023-05-08 NOTE — ED Provider Notes (Signed)
[place] [facility] Provider Note   CSN: [id number] Arrival date & time: 05/08/23  1012     History  Chief Complaint  Patient presents with   Cough    Maria Mccarthy is a 38 y.o. female.  Pt is a 38 yo female with pmhx significant for copd (no inhalers) and anemia.  She was seen here on 1/26 and was diagnosed with the flu.  She was given a rx for Tamiflu and Tessalon Perles.  Pt comes back today because she's had worsening sob and cough.         Home Medications Prior to Admission medications   Medication Sig Start Date End Date Taking? Authorizing Provider  chlorpheniramine-HYDROcodone (TUSSIONEX) 10-8 MG/5ML Take 5 mLs by mouth every 12 (twelve) hours as needed for cough. 05/08/23  Yes Jacalyn Lefevre, MD  predniSONE (DELTASONE) 50 MG tablet Take 1 tablet (50 mg total) by mouth daily with breakfast. 05/08/23  Yes Jacalyn Lefevre, MD  acetaminophen-codeine (TYLENOL #3) 300-30 MG tablet Take 1-2 tablets by mouth every 6 (six) hours as needed for moderate pain. 01/05/23   Sloan Leiter, DO  benzonatate (TESSALON) 100 MG capsule Take 1 capsule (100 mg total) by mouth every 8 (eight) hours. 05/06/23   Arabella Merles, PA-C  budesonide-formoterol (SYMBICORT) 160-4.5 MCG/ACT inhaler Inhale 2 puffs into the lungs in the morning and at bedtime. 01/23/21   Molpus, John, MD  ibuprofen (ADVIL) 800 MG tablet Take 1 tablet (800 mg total) by mouth every 8 (eight) hours as needed for mild pain or moderate pain. 01/05/23   Sloan Leiter, DO  oseltamivir (TAMIFLU) 75 MG capsule Take 1 capsule (75 mg total) by mouth every 12 (twelve) hours. 05/06/23   Arabella Merles, PA-C      Allergies    Latex    Review of Systems   Review of Systems  Respiratory:  Positive for cough and shortness of breath.   All other systems reviewed and are negative.   Physical Exam Updated Vital Signs BP 138/86 (BP Location: Left Arm)   Pulse 88   Temp 98.9 F (37.2 C)  (Oral)   Resp 18   Ht 5\' 2"  (1.575 m)   Wt 127 kg   LMP 05/06/2023 (Exact Date)   SpO2 100%   BMI 51.21 kg/m  Physical Exam Vitals and nursing note reviewed.  Constitutional:      Appearance: Normal appearance.  HENT:     Head: Normocephalic and atraumatic.     Comments: Small ulcers in mouth    Right Ear: External ear normal.     Left Ear: External ear normal.     Nose: Nose normal.     Mouth/Throat:     Mouth: Mucous membranes are moist.     Pharynx: Oropharynx is clear.  Eyes:     Extraocular Movements: Extraocular movements intact.     Conjunctiva/sclera: Conjunctivae normal.     Pupils: Pupils are equal, round, and reactive to light.  Cardiovascular:     Rate and Rhythm: Normal rate and regular rhythm.     Pulses: Normal pulses.     Heart sounds: Normal heart sounds.  Pulmonary:     Breath sounds: Wheezing present.  Abdominal:     General: Abdomen is flat. Bowel sounds are normal.     Palpations: Abdomen is soft.  Musculoskeletal:        General: Normal range of motion.     Cervical back: Normal range of motion  and neck supple.  Skin:    General: Skin is warm.     Capillary Refill: Capillary refill takes less than 2 seconds.  Neurological:     General: No focal deficit present.     Mental Status: She is alert and oriented to person, place, and time.  Psychiatric:        Mood and Affect: Mood normal.        Behavior: Behavior normal.     ED Results / Procedures / Treatments   Labs (all labs ordered are listed, but only abnormal results are displayed) Labs Reviewed - No data to display  EKG None  Radiology DG Chest 2 View Result Date: 05/08/2023 CLINICAL DATA:  Cough, shortness of breath EXAM: CHEST - 2 VIEW COMPARISON:  01/23/2021 FINDINGS: Heart and mediastinal contours are within normal limits. No focal opacities or effusions. No acute bony abnormality. IMPRESSION: No active cardiopulmonary disease. Electronically Signed   By: Charlett Nose M.D.   On:  05/08/2023 11:42    Procedures Procedures    Medications Ordered in ED Medications  albuterol (PROVENTIL) (2.5 MG/3ML) 0.083% nebulizer solution 2.5 mg (has no administration in time range)  albuterol (VENTOLIN HFA) 108 (90 Base) MCG/ACT inhaler 1-2 puff (has no administration in time range)  aerochamber Z-Stat Plus/medium 1 each (has no administration in time range)  dexamethasone (DECADRON) injection 10 mg (10 mg Intramuscular Given 05/08/23 1232)  ipratropium-albuterol (DUONEB) 0.5-2.5 (3) MG/3ML nebulizer solution 3 mL (3 mLs Nebulization Given 05/08/23 1236)  chlorpheniramine-HYDROcodone (TUSSIONEX) 10-8 MG/5ML suspension 5 mL (5 mLs Oral Given 05/08/23 1232)    ED Course/ Medical Decision Making/ A&P                                 Medical Decision Making Amount and/or Complexity of Data Reviewed Radiology: ordered.  Risk Prescription drug management.   This patient presents to the ED for concern of cough, sob, this involves an extensive number of treatment options, and is a complaint that carries with it a high risk of complications and morbidity.  The differential diagnosis includes pna, rad   Co morbidities that complicate the patient evaluation  Copd and anemia   Additional history obtained:  Additional history obtained from epic chart review External records from outside source obtained and reviewed including husband  Imaging Studies ordered:  I ordered imaging studies including cxr  I independently visualized and interpreted imaging which showed No active cardiopulmonary disease.  I agree with the radiologist interpretation   Medicines ordered and prescription drug management:  I ordered medication including duoneb/albuterol/decadron/tussionex  for sx  Reevaluation of the patient after these medicines showed that the patient improved I have reviewed the patients home medicines and have made adjustments as needed   Critical  Interventions:  nebs   Problem List / ED Course:  Influenza A:  pt is feeling better after nebs and steroids/tussionex.  Pt is not hypoxic.  She is stable for d/c.  Return if worse.    Reevaluation:  After the interventions noted above, I reevaluated the patient and found that they have :improved   Social Determinants of Health:  Lives at home   Dispostion:  After consideration of the diagnostic results and the patients response to treatment, I feel that the patent would benefit from discharge with outpatient f/u.          Final Clinical Impression(s) / ED Diagnoses Final diagnoses:  Influenza  A  Mild intermittent reactive airway disease with acute exacerbation    Rx / DC Orders ED Discharge Orders          Ordered    predniSONE (DELTASONE) 50 MG tablet  Daily with breakfast        05/08/23 1326    chlorpheniramine-HYDROcodone (TUSSIONEX) 10-8 MG/5ML  Every 12 hours PRN        05/08/23 1326              Jacalyn Lefevre, MD 05/08/23 1338

## 2023-05-08 NOTE — ED Triage Notes (Signed)
Reports dry cough, blisters in mouth and sore throat, and Seen here on 1/26 for URI and tested + for FluA.

## 2023-06-18 ENCOUNTER — Other Ambulatory Visit (HOSPITAL_BASED_OUTPATIENT_CLINIC_OR_DEPARTMENT_OTHER): Payer: Self-pay

## 2023-09-03 ENCOUNTER — Emergency Department (HOSPITAL_BASED_OUTPATIENT_CLINIC_OR_DEPARTMENT_OTHER)

## 2023-09-03 ENCOUNTER — Encounter (HOSPITAL_BASED_OUTPATIENT_CLINIC_OR_DEPARTMENT_OTHER): Payer: Self-pay | Admitting: Emergency Medicine

## 2023-09-03 ENCOUNTER — Other Ambulatory Visit: Payer: Self-pay

## 2023-09-03 ENCOUNTER — Emergency Department (HOSPITAL_BASED_OUTPATIENT_CLINIC_OR_DEPARTMENT_OTHER)
Admission: EM | Admit: 2023-09-03 | Discharge: 2023-09-03 | Disposition: A | Discharge status: Home / Self Care | Attending: Emergency Medicine | Admitting: Emergency Medicine

## 2023-09-03 DIAGNOSIS — K5900 Constipation, unspecified: Secondary | ICD-10-CM | POA: Insufficient documentation

## 2023-09-03 DIAGNOSIS — R059 Cough, unspecified: Secondary | ICD-10-CM | POA: Diagnosis not present

## 2023-09-03 DIAGNOSIS — B9689 Other specified bacterial agents as the cause of diseases classified elsewhere: Secondary | ICD-10-CM | POA: Insufficient documentation

## 2023-09-03 DIAGNOSIS — M25561 Pain in right knee: Secondary | ICD-10-CM | POA: Diagnosis not present

## 2023-09-03 DIAGNOSIS — N760 Acute vaginitis: Secondary | ICD-10-CM | POA: Diagnosis not present

## 2023-09-03 DIAGNOSIS — R1084 Generalized abdominal pain: Secondary | ICD-10-CM

## 2023-09-03 DIAGNOSIS — B3731 Acute candidiasis of vulva and vagina: Secondary | ICD-10-CM

## 2023-09-03 DIAGNOSIS — Z91040 Latex allergy status: Secondary | ICD-10-CM | POA: Diagnosis not present

## 2023-09-03 DIAGNOSIS — R1030 Lower abdominal pain, unspecified: Secondary | ICD-10-CM | POA: Diagnosis present

## 2023-09-03 LAB — COMPREHENSIVE METABOLIC PANEL WITH GFR
ALT: 11 U/L (ref 0–44)
AST: 15 U/L (ref 15–41)
Albumin: 3.6 g/dL (ref 3.5–5.0)
Alkaline Phosphatase: 50 U/L (ref 38–126)
Anion gap: 10 (ref 5–15)
BUN: 7 mg/dL (ref 6–20)
CO2: 23 mmol/L (ref 22–32)
Calcium: 8.6 mg/dL — ABNORMAL LOW (ref 8.9–10.3)
Chloride: 106 mmol/L (ref 98–111)
Creatinine, Ser: 0.69 mg/dL (ref 0.44–1.00)
GFR, Estimated: >60 mL/min (ref >60)
Glucose, Bld: 95 mg/dL (ref 70–99)
Potassium: 3.8 mmol/L (ref 3.5–5.1)
Sodium: 139 mmol/L (ref 135–145)
Total Bilirubin: 0.5 mg/dL (ref 0.0–1.2)
Total Protein: 6.5 g/dL (ref 6.5–8.1)

## 2023-09-03 LAB — URINALYSIS, W/ REFLEX TO CULTURE (INFECTION SUSPECTED)
Bilirubin Urine: NEGATIVE
Glucose, UA: NEGATIVE mg/dL
Ketones, ur: NEGATIVE mg/dL
Leukocytes,Ua: NEGATIVE
Nitrite: POSITIVE — AB
Protein, ur: NEGATIVE mg/dL
Specific Gravity, Urine: 1.025 (ref 1.005–1.030)
pH: 6.5 (ref 5.0–8.0)

## 2023-09-03 LAB — LIPASE, BLOOD: Lipase: 26 U/L (ref 11–51)

## 2023-09-03 LAB — CBC WITH DIFFERENTIAL/PLATELET
Abs Immature Granulocytes: 0.01 10*3/uL (ref 0.00–0.07)
Basophils Absolute: 0.1 10*3/uL (ref 0.0–0.1)
Basophils Relative: 1 %
Eosinophils Absolute: 0.2 10*3/uL (ref 0.0–0.5)
Eosinophils Relative: 2 %
HCT: 34.3 % — ABNORMAL LOW (ref 36.0–46.0)
Hemoglobin: 11.2 g/dL — ABNORMAL LOW (ref 12.0–15.0)
Immature Granulocytes: 0 %
Lymphocytes Relative: 37 %
Lymphs Abs: 2.6 10*3/uL (ref 0.7–4.0)
MCH: 25.6 pg — ABNORMAL LOW (ref 26.0–34.0)
MCHC: 32.7 g/dL (ref 30.0–36.0)
MCV: 78.5 fL — ABNORMAL LOW (ref 80.0–100.0)
Monocytes Absolute: 0.5 10*3/uL (ref 0.1–1.0)
Monocytes Relative: 6 %
Neutro Abs: 3.8 10*3/uL (ref 1.7–7.7)
Neutrophils Relative %: 54 %
Platelets: 354 10*3/uL (ref 150–400)
RBC: 4.37 MIL/uL (ref 3.87–5.11)
RDW: 15.8 % — ABNORMAL HIGH (ref 11.5–15.5)
WBC: 7.1 10*3/uL (ref 4.0–10.5)
nRBC: 0 % (ref 0.0–0.2)

## 2023-09-03 LAB — WET PREP, GENITAL
Clue Cells Wet Prep HPF POC: NONE SEEN
Sperm: NONE SEEN
Trich, Wet Prep: NONE SEEN
WBC, Wet Prep HPF POC: <10 (ref <10)

## 2023-09-03 LAB — HCG, SERUM, QUALITATIVE: Preg, Serum: NEGATIVE

## 2023-09-03 MED ORDER — IOHEXOL 300 MG/ML  SOLN
100.0000 mL | Freq: Once | INTRAMUSCULAR | Status: AC | PRN
  Administered 2023-09-03: 100 mL via INTRAVENOUS

## 2023-09-03 MED ORDER — FLUCONAZOLE 150 MG PO TABS
150.0000 mg | ORAL_TABLET | Freq: Once | ORAL | Status: AC
  Administered 2023-09-03: 150 mg via ORAL
  Filled 2023-09-03: qty 1
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
## 2023-09-03 NOTE — ED Notes (Signed)
 Pt alert and oriented X 4 at the time of discharge. RR even and unlabored. No acute distress noted. Pt verbalized understanding of discharge instructions as discussed. Pt ambulatory to lobby at time of discharge.

## 2023-09-03 NOTE — ED Provider Notes (Signed)
 [place] [facility] Provider Note   CSN: [id number] Arrival date & time: 09/03/23  9562     History  Chief Complaint  Patient presents with   Abdominal Pain    Left rib pain    Peony Barner is a 38 y.o. female.  Patient is a 38 year old female who presents with different complaints.  She mostly complains of pain in her abdomen.  She says it has been going on for the last couple of weeks.  She says it comes and goes but is mostly constant.  It is mostly across her right abdomen and her lower abdomen.  She does have some pain in her left upper abdomen under the rib cage.  She has had a little bit of cough which is mostly nonproductive.  No shortness of breath.  No pleuritic pain.  No known fevers.  No urinary symptoms.  She has had some nausea but no vomiting.  She does have some constipation.  She says sometimes she will notice a little vaginal discharge but none today.  She also has been having some swelling intermittently of her right knee.  No recent injuries.       Home Medications Prior to Admission medications   Medication Sig Start Date End Date Taking? Authorizing Provider  acetaminophen -codeine  (TYLENOL  #3) 300-30 MG tablet Take 1-2 tablets by mouth every 6 (six) hours as needed for moderate pain. 01/05/23   Teddi Favors, DO  benzonatate  (TESSALON ) 100 MG capsule Take 1 capsule (100 mg total) by mouth every 8 (eight) hours. 05/06/23   Franaszek, Amanda, PA-C  budesonide -formoterol  (SYMBICORT ) 160-4.5 MCG/ACT inhaler Inhale 2 puffs into the lungs in the morning and at bedtime. 01/23/21   Molpus, John, MD  chlorpheniramine-HYDROcodone  (TUSSIONEX) 10-8 MG/5ML Take 5 mLs by mouth every 12 (twelve) hours as needed for cough. 05/08/23   Sueellen Emery, MD  ibuprofen  (ADVIL ) 800 MG tablet Take 1 tablet (800 mg total) by mouth every 8 (eight) hours as needed for mild pain or moderate pain. 01/05/23   Teddi Favors, DO  oseltamivir  (TAMIFLU ) 75  MG capsule Take 1 capsule (75 mg total) by mouth every 12 (twelve) hours. 05/06/23   Franaszek, Amanda, PA-C  predniSONE  (DELTASONE ) 50 MG tablet Take 1 tablet (50 mg total) by mouth daily with breakfast. 05/08/23   Sueellen Emery, MD      Allergies    Latex    Review of Systems   Review of Systems  Constitutional:  Negative for chills, diaphoresis, fatigue and fever.  HENT:  Negative for congestion, rhinorrhea and sneezing.   Eyes: Negative.   Respiratory:  Positive for cough. Negative for chest tightness and shortness of breath.   Cardiovascular:  Negative for chest pain and leg swelling.  Gastrointestinal:  Positive for abdominal pain, constipation and nausea. Negative for blood in stool, diarrhea and vomiting.  Genitourinary:  Negative for difficulty urinating, flank pain, frequency and hematuria.  Musculoskeletal:  Positive for arthralgias. Negative for back pain.  Skin:  Negative for rash.  Neurological:  Negative for dizziness, speech difficulty, weakness, numbness and headaches.    Physical Exam Updated Vital Signs BP 109/67   Pulse 69   Temp 99.2 F (37.3 C)   Resp 18   Ht 5\' 2"  (1.575 m)   Wt 130.7 kg   LMP 08/22/2023   SpO2 100%   BMI 52.69 kg/m  Physical Exam Constitutional:      Appearance: She is well-developed.  HENT:  Head: Normocephalic and atraumatic.  Eyes:     Pupils: Pupils are equal, round, and reactive to light.  Cardiovascular:     Rate and Rhythm: Normal rate and regular rhythm.     Heart sounds: Normal heart sounds.  Pulmonary:     Effort: Pulmonary effort is normal. No respiratory distress.     Breath sounds: Normal breath sounds. No wheezing or rales.  Chest:     Chest wall: No tenderness.  Abdominal:     General: Bowel sounds are normal.     Palpations: Abdomen is soft.     Tenderness: There is abdominal tenderness in the right upper quadrant, right lower quadrant, periumbilical area and left upper quadrant. There is no guarding or  rebound.  Musculoskeletal:        General: Normal range of motion.     Cervical back: Normal range of motion and neck supple.     Comments: Mild pain on range of motion of the right knee.  No swelling or deformity.  No warmth or erythema.  No swelling to the lower legs.  Neurovascular intact distally.  Lymphadenopathy:     Cervical: No cervical adenopathy.  Skin:    General: Skin is warm and dry.     Findings: No rash.  Neurological:     Mental Status: She is alert and oriented to person, place, and time.     ED Results / Procedures / Treatments   Labs (all labs ordered are listed, but only abnormal results are displayed) Labs Reviewed  WET PREP, GENITAL - Abnormal; Notable for the following components:      Result Value   Yeast Wet Prep HPF POC PRESENT (*)    All other components within normal limits  COMPREHENSIVE METABOLIC PANEL WITH GFR - Abnormal; Notable for the following components:   Calcium 8.6 (*)    All other components within normal limits  CBC WITH DIFFERENTIAL/PLATELET - Abnormal; Notable for the following components:   Hemoglobin 11.2 (*)    HCT 34.3 (*)    MCV 78.5 (*)    MCH 25.6 (*)    RDW 15.8 (*)    All other components within normal limits  URINALYSIS, W/ REFLEX TO CULTURE (INFECTION SUSPECTED) - Abnormal; Notable for the following components:   APPearance CLOUDY (*)    Hgb urine dipstick TRACE (*)    Nitrite POSITIVE (*)    Bacteria, UA MANY (*)    All other components within normal limits  LIPASE, BLOOD  HCG, SERUM, QUALITATIVE  GC/CHLAMYDIA PROBE AMP ([place]) NOT AT [facility]    EKG EKG Interpretation Date/Time:  Monday Sep 03 2023 08:24:30 EDT Ventricular Rate:  72 PR Interval:  161 QRS Duration:  82 QT Interval:  386 QTC Calculation: 423 R Axis:   19  Text Interpretation: Sinus rhythm Abnormal R-wave progression, early transition since last tracing no significant change Confirmed by Hershel Los [id number]) on 09/03/2023 9:21:09  AM  Radiology DG Chest 2 View Result Date: 09/03/2023 CLINICAL DATA:  Cough. EXAM: CHEST - 2 VIEW COMPARISON:  05/08/2023 FINDINGS: The lungs are clear without focal pneumonia, edema, pneumothorax or pleural effusion. The cardiopericardial silhouette is within normal limits for size. Thoracolumbar scoliosis evident. Telemetry leads overlie the chest. IMPRESSION: No active cardiopulmonary disease. Electronically Signed   By: Donnal Fusi M.D.   On: 09/03/2023 11:03   CT ABDOMEN PELVIS W CONTRAST Result Date: 09/03/2023 CLINICAL DATA:  Abdominal pain. EXAM: CT ABDOMEN AND PELVIS WITH CONTRAST TECHNIQUE: Multidetector CT  imaging of the abdomen and pelvis was performed using the standard protocol following bolus administration of intravenous contrast. RADIATION DOSE REDUCTION: This exam was performed according to the departmental dose-optimization program which includes automated exposure control, adjustment of the mA and/or kV according to patient size and/or use of iterative reconstruction technique. CONTRAST:  OMNIPAQUE  IOHEXOL  300 MG/ML  SOLN COMPARISON:  01/05/2023. FINDINGS: Lower chest: No acute abnormality. No pleural or pericardial effusions. Hepatobiliary: No focal liver abnormality is seen. No gallstones, gallbladder wall thickening, or biliary dilatation. Pancreas: Unremarkable. No pancreatic ductal dilatation or surrounding inflammatory changes. Spleen: Normal in size without focal abnormality. Adrenals/Urinary Tract: Adrenal glands are unremarkable. Kidneys are normal, without renal calculi, focal lesion, or hydronephrosis. Bladder is unremarkable. Stomach/Bowel: Stomach is within normal limits. Appendix appears normal. No evidence of bowel wall thickening, distention, or inflammatory changes. Diverticulosis noted especially of the descending and sigmoid. Vascular/Lymphatic: No significant vascular findings are present. No enlarged abdominal or pelvic lymph nodes. Reproductive: Uterus and  bilateral adnexa are unremarkable. Other: No abdominal wall hernia or abnormality. No abdominopelvic ascites. Musculoskeletal: No acute or significant osseous findings. IMPRESSION: 1. Diverticulosis. 2. No acute abdominal or pelvic pathology. Electronically Signed   By: Sydell Eva M.D.   On: 09/03/2023 10:48    Procedures Procedures    Medications Ordered in ED Medications  iohexol  (OMNIPAQUE ) 300 MG/ML solution 100 mL (100 mLs Intravenous Contrast Given 09/03/23 1017)  fluconazole  (DIFLUCAN ) tablet 150 mg (150 mg Oral Given 09/03/23 1150)    ED Course/ Medical Decision Making/ A&P                                 Medical Decision Making Amount and/or Complexity of Data Reviewed Labs: ordered. Radiology: ordered.  Risk Prescription drug management.   Patient is a 37 year old who presents mostly complaining of some vague abdominal pain that has been intermittent for the last couple of weeks.  She had some mild generalized tenderness.  She denies any urinary symptoms.  Her urine is not consistent with infection.  She has some vague intermittent vaginal discharge but no pelvic pain.  No ongoing discharge.  She did a self swab and there is positive yeast but no other concerns.  GC chlamydia are pending.  She does not have other pain that we more concerning for tubo-ovarian abscess or ovarian torsion.  Her pregnancy test is negative.  CT abdomen pelvis does not show any concerning findings.  No evidence of pancreatitis.  No evidence of bowel obstruction.  She also has a little bit of cough and pain in her under her left rib.  Although it seems to be more abdominal in nature.  Chest x-ray was interpreted by me and confirmed by the radiologist to show no evidence of pneumonia.  No pneumothorax or other concerns.  She does not have other symptoms that would be more concerning for PE.  She was given a dose of Diflucan  here in the ED.  She was discharged home in good condition.  She was encouraged  to follow-up with her primary care doctor.  Return precautions were given.  Final Clinical Impression(s) / ED Diagnoses Final diagnoses:  Generalized abdominal pain  Candidal vaginitis    Rx / DC Orders ED Discharge Orders     None         Hershel Los, MD 09/03/23 1159

## 2023-09-04 LAB — GC/CHLAMYDIA PROBE AMP (~~LOC~~) NOT AT ARMC
Chlamydia: NEGATIVE
Comment: NEGATIVE
Comment: NORMAL
Neisseria Gonorrhea: NEGATIVE

## 2024-01-30 ENCOUNTER — Encounter (HOSPITAL_BASED_OUTPATIENT_CLINIC_OR_DEPARTMENT_OTHER): Payer: Self-pay | Admitting: Emergency Medicine

## 2024-01-30 ENCOUNTER — Other Ambulatory Visit: Payer: Self-pay

## 2024-01-30 ENCOUNTER — Emergency Department (HOSPITAL_BASED_OUTPATIENT_CLINIC_OR_DEPARTMENT_OTHER)
Admission: EM | Admit: 2024-01-30 | Discharge: 2024-01-30 | Disposition: A | Discharge status: Home / Self Care | Attending: Emergency Medicine | Admitting: Emergency Medicine

## 2024-01-30 DIAGNOSIS — Z79899 Other long term (current) drug therapy: Secondary | ICD-10-CM | POA: Diagnosis not present

## 2024-01-30 DIAGNOSIS — Z91040 Latex allergy status: Secondary | ICD-10-CM | POA: Insufficient documentation

## 2024-01-30 DIAGNOSIS — K029 Dental caries, unspecified: Secondary | ICD-10-CM | POA: Insufficient documentation

## 2024-01-30 DIAGNOSIS — K0889 Other specified disorders of teeth and supporting structures: Secondary | ICD-10-CM | POA: Insufficient documentation

## 2024-01-30 MED ORDER — AMOXICILLIN-POT CLAVULANATE 875-125 MG PO TABS
1.0000 | ORAL_TABLET | Freq: Once | ORAL | Status: AC
  Administered 2024-01-30: 1 via ORAL
  Filled 2024-01-30: qty 1

## 2024-01-30 MED ORDER — AMOXICILLIN-POT CLAVULANATE 875-125 MG PO TABS: 1.0000 | ORAL_TABLET | Freq: Two times a day (BID) | ORAL | 0 refills | Status: AC

## 2024-01-30 MED ORDER — LIDOCAINE VISCOUS HCL 2 % MT SOLN
15.0000 mL | Freq: Once | OROMUCOSAL | Status: AC
  Administered 2024-01-30: 15 mL via OROMUCOSAL
  Filled 2024-01-30: qty 15

## 2024-01-30 MED ORDER — EUGENOL SOLN: 1.0000 | Freq: Four times a day (QID) | 1 refills | Status: AC | PRN

## 2024-01-30 MED ORDER — OXYCODONE-ACETAMINOPHEN 5-325 MG PO TABS
1.0000 | ORAL_TABLET | Freq: Once | ORAL | Status: AC
  Administered 2024-01-30: 1 via ORAL
  Filled 2024-01-30: qty 1

## 2024-01-30 MED ORDER — OXYCODONE HCL 5 MG PO TABS: 5.0000 mg | ORAL_TABLET | ORAL | 0 refills | Status: AC | PRN

## 2024-01-30 NOTE — ED Provider Notes (Signed)
 [place] [facility] Provider Note   CSN: [id number] Arrival date & time: 01/30/24  9341     Patient presents with: Dental Pain   Maria Mccarthy is a 38 y.o. female.   38 year old female history of dental caries who presents to the emergency department with left upper tooth pain.  Patient reports that on Monday she had an upper left molar extracted.  Says that since then she has been having significant pain in that tooth.  Has been trying Tylenol  and ibuprofen  without relief.       Prior to Admission medications   Medication Sig Start Date End Date Taking? Authorizing Provider  amoxicillin-clavulanate (AUGMENTIN) 875-125 MG tablet Take 1 tablet by mouth every 12 (twelve) hours. 01/30/24  Yes Yolande Lamar BROCKS, MD  Eugenol SOLN 1 Application by Does not apply route 4 (four) times daily as needed. 01/30/24  Yes Yolande Lamar BROCKS, MD  oxyCODONE (ROXICODONE) 5 MG immediate release tablet Take 1 tablet (5 mg total) by mouth every 4 (four) hours as needed for severe pain (pain score 7-10). 01/30/24  Yes Yolande Lamar BROCKS, MD  acetaminophen -codeine  (TYLENOL  #3) 300-30 MG tablet Take 1-2 tablets by mouth every 6 (six) hours as needed for moderate pain. 01/05/23   Elnor Jayson LABOR, DO  benzonatate  (TESSALON ) 100 MG capsule Take 1 capsule (100 mg total) by mouth every 8 (eight) hours. 05/06/23   Franaszek, Amanda, PA-C  budesonide -formoterol  (SYMBICORT ) 160-4.5 MCG/ACT inhaler Inhale 2 puffs into the lungs in the morning and at bedtime. 01/23/21   Molpus, John, MD  chlorpheniramine-HYDROcodone  (TUSSIONEX) 10-8 MG/5ML Take 5 mLs by mouth every 12 (twelve) hours as needed for cough. 05/08/23   Dean Clarity, MD  ibuprofen  (ADVIL ) 800 MG tablet Take 1 tablet (800 mg total) by mouth every 8 (eight) hours as needed for mild pain or moderate pain. 01/05/23   Elnor Jayson LABOR, DO  oseltamivir  (TAMIFLU ) 75 MG capsule Take 1 capsule (75 mg total) by mouth every 12  (twelve) hours. 05/06/23   Veta Palma, PA-C  predniSONE  (DELTASONE ) 50 MG tablet Take 1 tablet (50 mg total) by mouth daily with breakfast. 05/08/23   Dean Clarity, MD    Allergies: Latex    Review of Systems  Updated Vital Signs BP (!) 151/86   Pulse 71   Temp 98.2 F (36.8 C) (Temporal)   Resp 18   Ht 5' 2 (1.575 m)   Wt 135.2 kg   LMP 01/06/2024 (Approximate)   SpO2 99%   BMI 54.50 kg/m   Physical Exam Constitutional:      Appearance: Normal appearance.  HENT:     Head: Normocephalic and atraumatic.     Right Ear: External ear normal.     Left Ear: External ear normal.     Nose: Nose normal.     Mouth/Throat:     Mouth: Mucous membranes are moist.     Pharynx: Oropharynx is clear.     Comments: Uvula: without deviation or edema. Uvula midline. Soft palate: without swelling Sublingual: normal appearance/no brawny edema or tongue elevation Teeth and gums: No periapical swelling or fluctuance, no tooth fracture, no gingival swelling, no active oral bleeding.  Tenderness to palpation surrounding #14 tooth which is extracted.  Clot still present. Tonsils: no erythema or exudates  Eyes:     Extraocular Movements: Extraocular movements intact.     Conjunctiva/sclera: Conjunctivae normal.     Pupils: Pupils are equal, round, and reactive to light.  Pulmonary:  Breath sounds: No stridor.  Musculoskeletal:     Cervical back: Normal range of motion and neck supple.  Neurological:     Mental Status: She is alert.     (all labs ordered are listed, but only abnormal results are displayed) Labs Reviewed - No data to display  EKG: None  Radiology: No results found.   Procedures   Medications Ordered in the ED  amoxicillin-clavulanate (AUGMENTIN) 875-125 MG per tablet 1 tablet (1 tablet Oral Given 01/30/24 0722)  oxyCODONE-acetaminophen  (PERCOCET/ROXICET) 5-325 MG per tablet 1 tablet (1 tablet Oral Given 01/30/24 0722)  lidocaine  (XYLOCAINE ) 2 % viscous  mouth solution 15 mL (15 mLs Mouth/Throat Given 01/30/24 0724)                                    Medical Decision Making Risk Prescription drug management.   38 year old female presents emergency department with dental pain of tooth #14 after dental extraction  Initial Ddx:  Dry socket, deep space infection, dental carry, Ludwig's angina  MDM/Course:  Patient resents emergency department tooth pain after tooth extraction.  No signs of deep space infection or abscess at this point in time.  No signs of Ludwig's angina.  No signs of airway compromise.  Peers that she still has a clot present but based on the timeline and recent extraction may be developing some dry socket.  Could also be due to dental caries or just postoperative pain.  Will give her antibiotics and oxycodone to take.  Did give some gauze soaked in lidocaine  since we do not have you dental here.  Given gauze and eugenol oil prescription as well. Will have her follow-up with her dentist tomorrow as well  This patient presents to the ED for concern of complaints listed in HPI, this involves an extensive number of treatment options, and is a complaint that carries with it a high risk of complications and morbidity. Disposition including potential need for admission considered.   Dispo: DC Home. Return precautions discussed including, but not limited to, those listed in the AVS. Allowed pt time to ask questions which were answered fully prior to dc.  Additional history obtained from spouse Records reviewed Outpatient Clinic Notes I have reviewed the patients home medications and made adjustments as needed  Portions of this note were generated with Dragon dictation software. Dictation errors may occur despite best attempts at proofreading.     Final diagnoses:  Dental caries  Toothache    ED Discharge Orders          Ordered    amoxicillin-clavulanate (AUGMENTIN) 875-125 MG tablet  Every 12 hours        01/30/24  0810    oxyCODONE (ROXICODONE) 5 MG immediate release tablet  Every 4 hours PRN        01/30/24 0810    Eugenol SOLN  4 times daily PRN        01/30/24 0821               Yolande Lamar BROCKS, MD 01/30/24 [phone]

## 2024-01-30 NOTE — Discharge Instructions (Addendum)
 You were seen for your dental pain in the emergency department.   At home, please take Tylenol  and ibuprofen  for your pain.  You may also take the oxycodone we have prescribed you for any breakthrough pain that may have.  Do not take this before driving or operating heavy machinery.  Do not take this medication with alcohol.  You may also use the eugenol oil soaked in gauze 4 times a day  Take the antibiotics to treat any infection.  Follow-up with a dentist in 2-3 days regarding your visit.    Return immediately to the emergency department if you experience any of the following: Worsening pain, fever, difficulty swallowing, difficulty breathing, or any other concerning symptoms.    Thank you for visiting our Emergency Department. It was a pleasure taking care of you today.

## 2024-01-30 NOTE — ED Triage Notes (Signed)
 Pt states mouth pain and swelling. starting last week, tooth pulled on Monday, DDS used a tool to break tooth, pt states injured gum. Was told to take OTC meds for pain control. Has had no relief.
